# Patient Record
Sex: Male | Born: 1990 | Race: Black or African American | Hispanic: No | Marital: Single | Smoking: Never smoker
Health system: Southern US, Community
[De-identification: ages and names within clinical notes are randomized; demographics above are authoritative.]

## PROBLEM LIST (undated history)

## (undated) ENCOUNTER — Emergency Department (HOSPITAL_COMMUNITY): Admission: EM | Payer: Self-pay | Source: Home / Self Care

## (undated) DIAGNOSIS — E119 Type 2 diabetes mellitus without complications: Secondary | ICD-10-CM

## (undated) HISTORY — PX: KNEE SURGERY: SHX244

## (undated) HISTORY — PX: HAND SURGERY: SHX662

---

## 2015-10-02 ENCOUNTER — Emergency Department (HOSPITAL_COMMUNITY)
Admission: EM | Admit: 2015-10-02 | Discharge: 2015-10-02 | Disposition: A | Discharge status: Home / Self Care | Payer: Self-pay | Attending: Emergency Medicine | Admitting: Emergency Medicine

## 2015-10-02 ENCOUNTER — Encounter (HOSPITAL_COMMUNITY): Payer: Self-pay | Admitting: Emergency Medicine

## 2015-10-02 DIAGNOSIS — R21 Rash and other nonspecific skin eruption: Secondary | ICD-10-CM | POA: Insufficient documentation

## 2015-10-02 DIAGNOSIS — E109 Type 1 diabetes mellitus without complications: Secondary | ICD-10-CM | POA: Insufficient documentation

## 2015-10-02 MED ORDER — CLOTRIMAZOLE 1 % EX CREA: TOPICAL_CREAM | CUTANEOUS | Status: DC

## 2015-10-02 NOTE — ED Notes (Signed)
Pt. reports dry skin and multiple " small pimples " at shoulders , scalp and upper back with mild itching for several weeks .

## 2015-10-02 NOTE — Discharge Instructions (Signed)
Please use your medications as prescribed. Follow-up with your doctor or dermatology for further evaluation management of your symptoms. Return to ED for any new or worsening symptoms.

## 2015-10-02 NOTE — ED Provider Notes (Signed)
CSN: [id number]     Arrival date & time 10/02/15  1951 History  By signing my name below, I, Alan Burke, attest that this documentation has been prepared under the direction and in the presence of Alan Peek, PA-C  Electronically Signed: Jarvis Burke, ED Scribe. 10/02/2015. 8:44 PM.    Chief Complaint  Patient presents with  . Skin Problem   The history is provided by the patient. No language interpreter was used.    HPI Comments: Alan Burke is a 24 y.o. male with a h/o type I DM who presents to the Emergency Department complaining of dry, scabbed, skin to his scalp onset several weeks. He reports associated patches of itchy, dry, red skin to his shoulders and upper back. He denies any h/o similar skin rashes. Pt states he used to regularly dye his hair which he believes could have been the cause of the scabbing to her scalp. He denies any alleviating/aggravating factors. Pt has not taken any medications prior to arrival. He denies any new soaps, detergents, lotions, medications, etc. Pt states he does not regularly wear hats but he used to. Pt takes insulin 70/30 on sliding scale. He denies any mouth lesions, or other associated symptoms at this time.  History reviewed. No pertinent past medical history. History reviewed. No pertinent past surgical history. No family history on file. Social History  Substance Use Topics  . Smoking status: Never Smoker   . Smokeless tobacco: None  . Alcohol Use: No    Review of Systems  All other systems reviewed and are negative.     Allergies  Review of patient's allergies indicates no known allergies.  Home Medications   Prior to Admission medications   Medication Sig Start Date End Date Taking? Authorizing Provider  clotrimazole (LOTRIMIN) 1 % cream Apply to affected area 2 times daily 10/02/15   Alan Peek, PA-C   Triage Vitals: BP 137/73 mmHg  Pulse 69  Temp(Src) 98.6 F (37 C) (Oral)  Resp 16  SpO2  98%  Physical Exam  Constitutional: He is oriented to person, place, and time. He appears well-developed and well-nourished. No distress.  HENT:  Head: Normocephalic and atraumatic.  Area of mild folliculitis to superior occiput area. Oropharynx is clear and moist with no lesions.  Eyes: Conjunctivae and EOM are normal.  Neck: Neck supple. No tracheal deviation present.  Cardiovascular: Normal rate.   Pulmonary/Chest: Effort normal. No respiratory distress.  Musculoskeletal: Normal range of motion.  Neurological: He is alert and oriented to person, place, and time.  Skin: Skin is warm and dry.  Small, approximately 1cm in diameter lesion to right scapular shoulder. Area is dry, scaling and circumferential with no central clearing. No erythema, induration. Nontender. No drainage   Psychiatric: He has a normal mood and affect. His behavior is normal.  Nursing note and vitals reviewed.   ED Course  Procedures (including critical care time)  DIAGNOSTIC STUDIES: Oxygen Saturation is 98% on RA, normal by my interpretation.    COORDINATION OF CARE: 8:37 PM- will d/c pt home with antifungal. Pt advised of plan for treatment and pt agrees.     Labs Review Labs Reviewed - No data to display  Imaging Review No results found.   EKG Interpretation None     Meds given in ED:  Medications - No data to display  New Prescriptions   CLOTRIMAZOLE (LOTRIMIN) 1 % CREAM    Apply to affected area 2 times daily   Filed Vitals:  10/02/15 1959  BP: 137/73  Pulse: 69  Temp: 98.6 F (37 C)  TempSrc: Oral  Resp: 16  SpO2: 98%    MDM   Final diagnoses:  Rash   Patient with non specific rash. Not improved with hydrocortisone cream. Will treat with trial of anti-fungal medication. Pt instructed to keep the area dry. Contact precautions given. No signs of secondary infection. Follow up with Dermatology in 2-3 days. Return precautions discussed. Pt is safe for discharge at this  time. I personally performed the services described in this documentation, which was scribed in my presence. The recorded information has been reviewed and is accurate.    Alan PeekBenjamin Keisean Skowron, PA-C 10/02/15 2045  Alvira MondayErin Schlossman, MD 10/06/15 [phone]

## 2015-10-28 ENCOUNTER — Emergency Department (HOSPITAL_COMMUNITY)
Admission: EM | Admit: 2015-10-28 | Discharge: 2015-10-28 | Disposition: A | Discharge status: Home / Self Care | Payer: Self-pay | Attending: Emergency Medicine | Admitting: Emergency Medicine

## 2015-10-28 ENCOUNTER — Encounter (HOSPITAL_COMMUNITY): Payer: Self-pay | Admitting: Emergency Medicine

## 2015-10-28 DIAGNOSIS — R238 Other skin changes: Secondary | ICD-10-CM | POA: Insufficient documentation

## 2015-10-28 DIAGNOSIS — E119 Type 2 diabetes mellitus without complications: Secondary | ICD-10-CM | POA: Insufficient documentation

## 2015-10-28 DIAGNOSIS — Z79899 Other long term (current) drug therapy: Secondary | ICD-10-CM | POA: Insufficient documentation

## 2015-10-28 DIAGNOSIS — Z113 Encounter for screening for infections with a predominantly sexual mode of transmission: Secondary | ICD-10-CM | POA: Insufficient documentation

## 2015-10-28 DIAGNOSIS — Z711 Person with feared health complaint in whom no diagnosis is made: Secondary | ICD-10-CM

## 2015-10-28 HISTORY — DX: Type 2 diabetes mellitus without complications: E11.9

## 2015-10-28 NOTE — ED Notes (Signed)
C/o "bumps on penis" sts may not be new, no discharge or pain or other complaints, NAD

## 2015-10-28 NOTE — Discharge Instructions (Signed)
If your test results are positive, you will receive a call from the hospital. If they are negative, you will not be called. This takes 2-3 days.  Sexually Transmitted Disease  A sexually transmitted disease (STD) is a disease or infection that may be passed (transmitted) from person to person, usually during sexual activity. This may happen by way of saliva, semen, blood, vaginal mucus, or urine. Common STDs include:  Gonorrhea.  Chlamydia.  Syphilis.  HIV and AIDS.  Genital herpes.  Hepatitis B and C.  Trichomonas.  Human papillomavirus (HPV).  Pubic lice.  Scabies.  Mites.  Bacterial vaginosis. WHAT ARE CAUSES OF STDs?  An STD may be caused by bacteria, a virus, or parasites. STDs are often transmitted during sexual activity if one person is infected. However, they may also be transmitted through nonsexual means. STDs may be transmitted after:  Sexual intercourse with an infected person.  Sharing sex toys with an infected person.  Sharing needles with an infected person or using unclean piercing or tattoo needles.  Having intimate contact with the genitals, mouth, or rectal areas of an infected person.  Exposure to infected fluids during birth. WHAT ARE THE SIGNS AND SYMPTOMS OF STDs?  Different STDs have different symptoms. Some people may not have any symptoms. If symptoms are present, they may include:  Painful or bloody urination.  Pain in the pelvis, abdomen, vagina, anus, throat, or eyes.  A skin rash, itching, or irritation.  Growths, ulcerations, blisters, or sores in the genital and anal areas.  Abnormal vaginal discharge with or without bad odor.  Penile discharge in men.  Fever.  Pain or bleeding during sexual intercourse.  Swollen glands in the groin area.  Yellow skin and eyes (jaundice). This is seen with hepatitis.  Swollen testicles.  Infertility.  Sores and blisters in the mouth. HOW ARE STDs DIAGNOSED?  To make a diagnosis, your health care provider may:    Take a medical history.  Perform a physical exam.  Take a sample of any discharge to examine.  Swab the throat, cervix, opening to the penis, rectum, or vagina for testing.  Test a sample of your first morning urine.  Perform blood tests.  Perform a Pap test, if this applies.  Perform a colposcopy.  Perform a laparoscopy. HOW ARE STDs TREATED?  Treatment depends on the STD. Some STDs may be treated but not cured.  Chlamydia, gonorrhea, trichomonas, and syphilis can be cured with antibiotic medicine.  Genital herpes, hepatitis, and HIV can be treated, but not cured, with prescribed medicines. The medicines lessen symptoms.  Genital warts from HPV can be treated with medicine or by freezing, burning (electrocautery), or surgery. Warts may come back.  HPV cannot be cured with medicine or surgery. However, abnormal areas may be removed from the cervix, vagina, or vulva.  If your diagnosis is confirmed, your recent sexual partners need treatment. This is true even if they are symptom-free or have a negative culture or evaluation. They should not have sex until their health care providers say it is okay.  Your health care provider may test you for infection again 3 months after treatment. HOW CAN I REDUCE MY RISK OF GETTING AN STD?  Take these steps to reduce your risk of getting an STD:  Use latex condoms, dental dams, and water-soluble lubricants during sexual activity. Do not use petroleum jelly or oils.  Avoid having multiple sex partners.  Do not have sex with someone who has other sex partners  Do not have sex with anyone you do not know or who is at high risk for an STD.  Avoid risky sex practices that can break your skin.  Do not have sex if you have open sores on your mouth or skin.  Avoid drinking too much alcohol or taking illegal drugs. Alcohol and drugs can affect your judgment and put you in a vulnerable position.  Avoid engaging in oral and anal sex acts.  Get vaccinated for HPV  and hepatitis. If you have not received these vaccines in the past, talk to your health care provider about whether one or both might be right for you.  If you are at risk of being infected with HIV, it is recommended that you take a prescription medicine daily to prevent HIV infection. This is called pre-exposure prophylaxis (PrEP). You are considered at risk if:  You are a man who has sex with other men (MSM).  You are a heterosexual man or woman and are sexually active with more than one partner.  You take drugs by injection.  You are sexually active with a partner who has HIV. Talk with your health care provider about whether you are at high risk of being infected with HIV. If you choose to begin PrEP, you should first be tested for HIV. You should then be tested every 3 months for as long as you are taking PrEP. WHAT SHOULD I DO IF I THINK I HAVE AN STD?  See your health care provider.  Tell your sexual partner(s). They should be tested and treated for any STDs.  Do not have sex until your health care provider says it is okay. WHEN SHOULD I GET IMMEDIATE MEDICAL CARE?  Contact your health care provider right away if:  You have severe abdominal pain.  You are a man and notice swelling or pain in your testicles.  You are a woman and notice swelling or pain in your vagina. This information is not intended to replace advice given to you by your health care provider. Make sure you discuss any questions you have with your health care provider.  Document Released: 12/22/2002 Document Revised: 10/22/2014 Document Reviewed: 04/21/2013  Elsevier Interactive Patient Education [employer]

## 2015-10-28 NOTE — ED Provider Notes (Signed)
CSN: [id number]     Arrival date & time 10/28/15  1259 History  By signing my name below, I, Soijett Blue, attest that this documentation has been prepared under the direction and in the presence of Alveta Heimlich, PA-C Electronically Signed: Soijett Blue, ED Scribe. 10/28/2015. 1:59 PM.   Chief Complaint  Patient presents with  . Rash   The history is provided by the patient. No language interpreter was used.   Alan Burke is a 25 y.o. male with a medical hx of DM who presents to the Emergency Department requesting STD testing. He notes that his girlfriend has PID and that his girlfriend thinks that he gave it to her. He reports that he has bumps to his penis and that they have been there forever. Pt states that he scratched one yesterday due to it itching and that it popped. He reports that he would like a full STD testing to appease his girlfriend. He states that he has not tried any medications for the relief for his symptoms. He denies abdominal pain, dysuria, penile pain/swelling/discharge, testicular pain/swelling, painful BMs and any other symptoms.   Past Medical History  Diagnosis Date  . Diabetes mellitus without complication (HCC)    History reviewed. No pertinent past surgical history. No family history on file. Social History  Substance Use Topics  . Smoking status: Never Smoker   . Smokeless tobacco: None  . Alcohol Use: No    Review of Systems  Genitourinary: Negative for dysuria, discharge, penile swelling, scrotal swelling, genital sores, penile pain and testicular pain.  All other systems reviewed and are negative.     Allergies  Review of patient's allergies indicates no known allergies.  Home Medications   Prior to Admission medications   Medication Sig Start Date End Date Taking? Authorizing Provider  clotrimazole (LOTRIMIN) 1 % cream Apply to affected area 2 times daily 10/02/15   Joycie Peek, PA-C   BP 119/68 mmHg  Pulse 91  Temp(Src) 98.3 F  (36.8 C) (Oral)  Resp 18  Ht 5\' 8"  (1.727 m)  Wt 79.561 kg  BMI 26.68 kg/m2  SpO2 97% Physical Exam  Constitutional: He appears well-developed and well-nourished. No distress.  HENT:  Head: Normocephalic and atraumatic.  Right Ear: External ear normal.  Left Ear: External ear normal.  Eyes: Conjunctivae are normal. Right eye exhibits no discharge. Left eye exhibits no discharge. No scleral icterus.  Neck: Normal range of motion.  Cardiovascular: Normal rate and regular rhythm.   Pulmonary/Chest: Effort normal. No respiratory distress.  Abdominal: Soft. There is no tenderness.  Genitourinary: Testes normal and penis normal. Right testis shows no swelling and no tenderness. Left testis shows no swelling and no tenderness. Circumcised. No penile erythema or penile tenderness. No discharge found.  Small, flesh colored papules at base of penis. No vesicles, pustules, erythema, open sore, or rash noted. No penile discharge. Penis is non-tender. Testicles are non-tender without swelling or induration. No inguinal adenopathy.  Musculoskeletal: Normal range of motion.  Moves all extremities spontaneously  Lymphadenopathy:       Right: No inguinal adenopathy present.       Left: No inguinal adenopathy present.  Neurological: He is alert. Coordination normal.  Skin: Skin is warm and dry.  Psychiatric: He has a normal mood and affect. His behavior is normal.  Nursing note and vitals reviewed.   ED Course  Procedures (including critical care time) DIAGNOSTIC STUDIES: Oxygen Saturation is 99% on RA, nl by my interpretation.  COORDINATION OF CARE: 1:57 PM Discussed treatment plan with pt at bedside which includes HIV antibody, RPR, UA and pt agreed to plan.  Labs Review Labs Reviewed  RPR  HIV ANTIBODY (ROUTINE TESTING)  GC/CHLAMYDIA PROBE AMP ([place]) NOT AT [facility]    Imaging Review No results found. I have personally reviewed and evaluated these lab results as part of my  medical decision-making.   EKG Interpretation None      MDM   Final diagnoses:  Concern about STD in male without diagnosis   25 year old male presenting for STD testing. Girlfriend recently treated for PID and wants him to be tested. He notes small "bumps" to the penis that he believes has been there his whole life. VSS. Few small, flesh colored papules at base of penis. No concerning rash. No pain or swelling of penis and testicles. Abdomen is soft, non-tender. Pt declines urethral swab. Will send GC urine and RPR/HIV. Discussed importance of using protection when sexually active. Pt understands that they have GC/Chlamydia cultures pending and that they will need to inform all sexual partners if results return positive. Return precautions given in discharge paperwork and discussed with pt at bedside. Pt stable for discharge  I personally performed the services described in this documentation, which was scribed in my presence. The recorded information has been reviewed and is accurate.    Alveta HeimlichStevi Nikeshia Keetch, PA-C 10/28/15 1450  Bethann BerkshireJoseph Zammit, MD 10/29/15 [phone]

## 2015-10-29 LAB — HIV ANTIBODY (ROUTINE TESTING W REFLEX): HIV Screen 4th Generation wRfx: NONREACTIVE

## 2015-10-29 LAB — RPR: RPR: NONREACTIVE

## 2015-10-31 LAB — GC/CHLAMYDIA PROBE AMP (~~LOC~~) NOT AT ARMC
CHLAMYDIA, DNA PROBE: NEGATIVE
Neisseria Gonorrhea: NEGATIVE

## 2015-11-01 ENCOUNTER — Encounter (HOSPITAL_COMMUNITY): Payer: Self-pay | Admitting: Emergency Medicine

## 2015-11-01 ENCOUNTER — Emergency Department (HOSPITAL_COMMUNITY)
Admission: EM | Admit: 2015-11-01 | Discharge: 2015-11-01 | Disposition: A | Discharge status: Home / Self Care | Payer: Self-pay | Attending: Emergency Medicine | Admitting: Emergency Medicine

## 2015-11-01 DIAGNOSIS — R590 Localized enlarged lymph nodes: Secondary | ICD-10-CM | POA: Insufficient documentation

## 2015-11-01 DIAGNOSIS — Z79899 Other long term (current) drug therapy: Secondary | ICD-10-CM | POA: Insufficient documentation

## 2015-11-01 DIAGNOSIS — Z202 Contact with and (suspected) exposure to infections with a predominantly sexual mode of transmission: Secondary | ICD-10-CM | POA: Insufficient documentation

## 2015-11-01 DIAGNOSIS — E119 Type 2 diabetes mellitus without complications: Secondary | ICD-10-CM | POA: Insufficient documentation

## 2015-11-01 NOTE — ED Provider Notes (Signed)
CSN: [id number]     Arrival date & time 11/01/15  1247 History  By signing my name below, I, [name], attest that this documentation has been prepared under the direction and in the presence of Kerrie Buffalo, NP  Electronically Signed: [name], ED Scribe. 11/02/2015. 12:46 AM.     Chief Complaint  Patient presents with  . Exposure to STD    Patient is a 25 y.o. male presenting with STD exposure. The history is provided by the patient. No language interpreter was used.  Exposure to STD This is a new problem. The current episode started more than 1 week ago. The problem occurs rarely. The problem has not changed since onset.Pertinent negatives include no abdominal pain. Nothing aggravates the symptoms. Nothing relieves the symptoms. He has tried nothing for the symptoms.    HPI Comments: Alan Burke is a 25 y.o. male who presents to the Emergency Department requesting STD testing. Pt reports associated red and painful bumps to his genital area for at least 1 week. He notes that his girlfriend has recently been diagnosed with a UTI and BV and claims she likely has herpes. Pt was here 3 days ago for STD testing and endorses he had a blood test and urine test done but that they did not test him for herpes. Pt is requesting a herpes swab at this time. He denies any h/o STIs. Pt denies any abdominal pain, dysuria, penile pain/swelling, testicular pain or swelling or other associated symptoms at this time.   Past Medical History  Diagnosis Date  . Diabetes mellitus without complication (HCC)    History reviewed. No pertinent past surgical history. History reviewed. No pertinent family history. Social History  Substance Use Topics  . Smoking status: Never Smoker   . Smokeless tobacco: None  . Alcohol Use: No    Review of Systems  Gastrointestinal: Negative for abdominal pain.  Genitourinary: Positive for genital sores. Negative for dysuria, discharge, penile swelling, scrotal  swelling, difficulty urinating, penile pain and testicular pain.      Allergies  Review of patient's allergies indicates no known allergies.  Home Medications   Prior to Admission medications   Medication Sig Start Date End Date Taking? Authorizing Provider  clotrimazole (LOTRIMIN) 1 % cream Apply to affected area 2 times daily 10/02/15   Joycie Peek, PA-C   BP 120/78 mmHg  Pulse 80  Temp(Src) 97.7 F (36.5 C) (Oral)  Resp 16  Ht  (1.727 m)  Wt 79.379 kg  BMI 26.61 kg/m2  SpO2 100% Physical Exam  Constitutional: He is oriented to person, place, and time. He appears well-developed and well-nourished. No distress.  HENT:  Head: Normocephalic.  Eyes: EOM are normal.  Neck: Neck supple.  Cardiovascular: Normal rate.   Pulmonary/Chest: Effort normal.  Abdominal: Soft. There is no tenderness.  Genitourinary: Right testis shows no swelling and no tenderness. Left testis shows no swelling and no tenderness. Circumcised. No discharge found.  No urethral discharge noted. There are 3 tiny dry areas on the shaft of the penis, non tender.   Musculoskeletal: Normal range of motion.  Lymphadenopathy:       Right: Inguinal (pea sized) adenopathy present.  Neurological: He is alert and oriented to person, place, and time. No cranial nerve deficit.  Skin: Skin is warm and dry.  Psychiatric: He has a normal mood and affect. His behavior is normal.  Nursing note and vitals reviewed.   ED Course  Procedures (including critical care time)  DIAGNOSTIC STUDIES: Oxygen Saturation is 98% on RA, normal by my interpretation.    COORDINATION OF CARE: 1:47 PM-Will order herpes swab and GC/Chlamydia testing. Pt advised of plan for treatment and pt agrees.  Labs Review Labs Reviewed  HERPES SIMPLEX VIRUS CULTURE  GC/CHLAMYDIA PROBE AMP ([place]) NOT AT [facility]      MDM  25 y.o. male here for STD check and concern for possible HSV although lesions are a week old and dry.  Discussed with the patient signs and symptoms of HSV and possible recurrence if that was what the lesions were. STD testing done including HIV and RPR, GC, Chlamydia.   Final diagnoses:  STD exposure   I personally performed the services described in this documentation, which was scribed in my presence. The recorded information has been reviewed and is accurate.     [address] [place], [place] 11/02/15 1610  Lyndal Pulley, MD 11/02/15 [phone]

## 2015-11-01 NOTE — ED Notes (Signed)
Urine at bedside. 

## 2015-11-01 NOTE — ED Notes (Signed)
Pt requesting STD check; pt sts has some bumps in genital area

## 2015-11-02 LAB — GC/CHLAMYDIA PROBE AMP (~~LOC~~) NOT AT ARMC
Chlamydia: NEGATIVE
Neisseria Gonorrhea: NEGATIVE

## 2015-11-03 LAB — HERPES SIMPLEX VIRUS CULTURE: Culture: NOT DETECTED

## 2015-11-05 ENCOUNTER — Emergency Department (HOSPITAL_COMMUNITY): Payer: Self-pay

## 2015-11-05 ENCOUNTER — Encounter (HOSPITAL_COMMUNITY): Payer: Self-pay | Admitting: Emergency Medicine

## 2015-11-05 ENCOUNTER — Emergency Department (HOSPITAL_COMMUNITY)
Admission: EM | Admit: 2015-11-05 | Discharge: 2015-11-05 | Disposition: A | Discharge status: Home / Self Care | Payer: Self-pay | Attending: Emergency Medicine | Admitting: Emergency Medicine

## 2015-11-05 DIAGNOSIS — M791 Myalgia, unspecified site: Secondary | ICD-10-CM

## 2015-11-05 DIAGNOSIS — R059 Cough, unspecified: Secondary | ICD-10-CM

## 2015-11-05 DIAGNOSIS — E1165 Type 2 diabetes mellitus with hyperglycemia: Secondary | ICD-10-CM | POA: Insufficient documentation

## 2015-11-05 DIAGNOSIS — Z79899 Other long term (current) drug therapy: Secondary | ICD-10-CM | POA: Insufficient documentation

## 2015-11-05 DIAGNOSIS — R05 Cough: Secondary | ICD-10-CM | POA: Insufficient documentation

## 2015-11-05 DIAGNOSIS — R21 Rash and other nonspecific skin eruption: Secondary | ICD-10-CM

## 2015-11-05 DIAGNOSIS — R739 Hyperglycemia, unspecified: Secondary | ICD-10-CM

## 2015-11-05 LAB — URINE MICROSCOPIC-ADD ON: WBC UA: NONE SEEN WBC/hpf (ref 0–5)

## 2015-11-05 LAB — RPR: RPR Ser Ql: NONREACTIVE

## 2015-11-05 LAB — BASIC METABOLIC PANEL
Anion gap: 8 (ref 5–15)
BUN: 6 mg/dL (ref 6–20)
CALCIUM: 8.9 mg/dL (ref 8.9–10.3)
CO2: 25 mmol/L (ref 22–32)
CREATININE: 1.01 mg/dL (ref 0.61–1.24)
Chloride: 104 mmol/L (ref 101–111)
GFR calc Af Amer: >60 mL/min (ref >60)
GLUCOSE: 385 mg/dL — AB (ref 65–99)
POTASSIUM: 4.3 mmol/L (ref 3.5–5.1)
SODIUM: 137 mmol/L (ref 135–145)

## 2015-11-05 LAB — CBC
HCT: 40.4 % (ref 39.0–52.0)
Hemoglobin: 13.3 g/dL (ref 13.0–17.0)
MCH: 27.4 pg (ref 26.0–34.0)
MCHC: 32.9 g/dL (ref 30.0–36.0)
MCV: 83.3 fL (ref 78.0–100.0)
PLATELETS: 182 10*3/uL (ref 150–400)
RBC: 4.85 MIL/uL (ref 4.22–5.81)
RDW: 13.3 % (ref 11.5–15.5)
WBC: 9 10*3/uL (ref 4.0–10.5)

## 2015-11-05 LAB — URINALYSIS, ROUTINE W REFLEX MICROSCOPIC
BILIRUBIN URINE: NEGATIVE
HGB URINE DIPSTICK: NEGATIVE
Ketones, ur: NEGATIVE mg/dL
Leukocytes, UA: NEGATIVE
Nitrite: NEGATIVE
PROTEIN: NEGATIVE mg/dL
Specific Gravity, Urine: 1.018 (ref 1.005–1.030)
pH: 6 (ref 5.0–8.0)

## 2015-11-05 LAB — CK: Total CK: 304 U/L (ref 49–397)

## 2015-11-05 LAB — HIV ANTIBODY (ROUTINE TESTING W REFLEX): HIV Screen 4th Generation wRfx: NONREACTIVE

## 2015-11-05 NOTE — Discharge Instructions (Signed)
°Emergency Department Resource Guide °1) Find a Doctor and Pay Out of Pocket °Although you won't have to find out who is covered by your insurance plan, it is a good idea to ask around and get recommendations. You will then need to call the office and see if the doctor you have chosen will accept you as a new patient and what types of options they offer for patients who are self-pay. Some doctors offer discounts or will set up payment plans for their patients who do not have insurance, but you will need to ask so you aren't surprised when you get to your appointment. ° °2) Contact Your Local Health Department °Not all health departments have doctors that can see patients for sick visits, but many do, so it is worth a call to see if yours does. If you don't know where your local health department is, you can check in your phone book. The CDC also has a tool to help you locate your state's health department, and many state websites also have listings of all of their local health departments. ° °3) Find a Walk-in Clinic °If your illness is not likely to be very severe or complicated, you may want to try a walk in clinic. These are popping up all over the country in pharmacies, drugstores, and shopping centers. They're usually staffed by nurse practitioners or physician assistants that have been trained to treat common illnesses and complaints. They're usually fairly quick and inexpensive. However, if you have serious medical issues or chronic medical problems, these are probably not your best option. ° °No Primary Care Doctor: °- Call [facility] at  832-8000 - they can help you locate a primary care doctor that  accepts your insurance, provides certain services, etc. °- Physician Referral Service- 1-800-533-3463 ° °Chronic Pain Problems: °Organization         Address  Phone   Notes  °[facility]  (336) 297-2271 Patients need to be referred by their primary care doctor.  ° °Medication  Assistance: °Organization         Address  Phone   Notes  °[place] [facility] [address] °[place], [place] [address] (336) 641-8030 --Must be a resident of [place] °-- Must have NO insurance coverage whatsoever (no Medicaid/ Medicare, etc.) °-- The pt. MUST have a primary care doctor that directs their care regularly and follows them in the community °  °[facility]  (866) 331-1348   °[employer]  (888) 892-1162   ° °Agencies that provide inexpensive medical care: °Organization         Address  Phone   Notes  °[facility]  (336) 832-8035   °[facility]    (336) 832-7272   °[facility] [address] °[place], [place] [address] (336) 832-4777   °[facility] [address], °[place] (336) 271-4999   °[facility]    (336) 373-0678   °[facility]    (336) 272-1050   °[facility] ° [address], [place] Phone:  (336) 832-4444, Fax:  (336) 832-4440 Hours of Operation:  9 am - 6 pm, M-F.  Also accepts Medicaid/Medicare and self-pay.  °[facility] ° [address], [place] Phone: (336) 832-3150, Fax: (336) 832-3151. Hours of Operation:  8:30 am - 5:30 pm, M-F.  Also accepts Medicaid and self-pay.  °[facility] [place] [address], [place] Phone: (336) 878-6027   °[facility] [address], [place], [place] (336)723-1848, Ext. 123 Mondays & Thursdays: 7-9 AM.  First 15 patients are seen on a first come, first serve basis. °  ° °Medicaid-accepting [place] Providers: ° °Organization         Address  Phone   Notes  °[facility] [address], [place] (336) 641-2100 Also accepts self-pay patients.  °[facility] [address], [place] ° (336) 856-9996   °[facility] [address], [place]  (336) 288-8857   °[facility] [address] [place] [address], [place] (336) 299-7000   °Veita Bland [address], [place]  ° (336) 373-1557 Only accepts [place] Access Medicaid patients after they have their name applied to their card.  ° °Self-Pay (no insurance) in [place]: ° °Organization         Address  Phone   Notes  °Sickle Cell Patients, [facility] [address], [place] (336) 832-1970   °[facility] [address], [place] (336) 832-4400   °[facility] ° [address], [place] (336) 992-4800   °[facility]/Dr. Osei-Bonsu ° [address] [place] [address], [place] or [address], [place] (336) 841-8500 Phone number for both [place] and [place] locations is the same.  °[facility] [address], [place] (336) 299-0000   °[facility] [address] [place] [address], [place] or [address] (336) 852-7530 °(336) 878-2260   °[facility] [address], [place] (336) 350-1642, phone; (336) 294-5005, fax Sees patients 1st and 3rd Saturday of every month.  Must not qualify for public or private insurance (i.e. Medicaid, Medicare, [place] Health Choice, Veterans' Benefits) • Household income should be no more than 200% of the poverty level •The clinic cannot treat you if you are pregnant or think you are pregnant • Sexually transmitted diseases are not treated at the clinic.  ° ° °Dental Care: °Organization         Address  Phone  Notes  °[place] [facility] [address], [place] (336) 641-6152 Accepts children up to age 21 who are enrolled in Medicaid or [place] Health Choice; pregnant women with a Medicaid card; and children who have applied for Medicaid or [place] Health Choice, but were declined, whose parents can pay a reduced fee at time of service.  °[place]  [facility] [place]  [address], [place] (336) 641-7733 Accepts children up to age 21 who are enrolled in Medicaid or [place] Health Choice; pregnant women with a Medicaid card; and children who have applied for Medicaid or [place] Health Choice, but were declined, whose parents can pay a reduced fee at time of service.  °[facility] ° [address],  (336) 641-4533 Patients are seen by appointment only. Walk-ins are not accepted. [facility] will see patients 18 years of age and older. °Monday - Tuesday (8am-5pm) °Most Wednesdays (8:30-5pm) °$30 per visit, cash only  °[facility] ° [address], [place] (336) 641-4533 Patients are seen by appointment only. Walk-ins are not accepted. [facility] will see patients 18 years of age and older. °One   Wednesday Evening (Monthly: Volunteer Based).  $30 per visit, cash only  °[facility]  (919) 537-3737 for adults; Children under age 4, call Graduate Pediatric Dentistry at (919) [phone]. Children aged 4-14, please call (919) 537-3737 to request a pediatric application. ° Dental services are provided in all areas of dental care including fillings, crowns and bridges, complete and partial dentures, implants, gum treatment, root canals, and extractions. Preventive care is also provided. Treatment is provided to both adults and children. °Patients are selected via a lottery and there is often a waiting list. °  °[facility] [address], °[place] ° (336) 763-8833 [email or web address] °  °[facility] [address], [place], [place] (336)723-1848, Ext. 123 Second and Fourth Thursday of each month, opens at 6:30 AM; Clinic ends at 9 AM.  Patients are seen on a first-come first-served basis, and a limited number are seen during each clinic.  ° °[facility] ° [address], [place], [place] (336) 723-7904    Eligibility Requirements °You must have lived in [place], [place], or [place] counties for at least the last three months. °  You cannot be eligible for state or federal sponsored healthcare insurance, including Veterans Administration, Medicaid, or Medicare. °  You generally cannot be eligible for healthcare insurance through your employer.  °  How to apply: °Eligibility screenings are held every Tuesday and Wednesday afternoon from 1:00 pm until 4:00 pm. You do not need an appointment for the interview!  °[facility] [address], [place], [place] 336-631-2330   °[facility] [facility]  336-342-8273   °[facility]  336-703-3100   °[facility]  336-570-6415   ° °Behavioral Health Resources in the Community: °Intensive Outpatient Programs °Organization         Address  Phone  Notes  °[facility] [address], [place], [place] 336-878-6098   °[facility] [address], [place], [place] 336-832-9800   °[facility] [address], [place], [place] ° 336-882-2125   °[facility] [address],  °[place], [place] 1-800-853-5163 or 336-641-4981   °Substance Abuse Resources °Organization         Address  Phone  Notes  °[facility]  336-882-2125   °[facility]  336-784-9470   °[facility]  336-285-9073   °[facility]  336-845-3988   °Residential & Outpatient Substance Abuse Program  1-800-659-3381   °Psychological Services °Organization         Address  Phone  Notes  °[facility]  336- 832-9600   °[facility]  336- 378-7881   °[facility] [address], [place] 1-800-853-5163 or 336-641-4981   ° °Mobile Crisis Teams °Organization         Address  Phone  Notes  °[facility], Mobile Crisis Care Unit  1-877-626-1772   °[facility]ive °[facility] ° [address]  [place], [place] 336-834-9664   °Sharon DeEsch [address] °[place] 336-554-5454   ° °Self-Help/Support Groups °Organization         Address  Phone             Notes  °[facility]  - variety of support groups  336- 373-1402 Call for more information  °Narcotics Anonymous (NA), [facility] [address], °[place]  2 meetings at this location  ° °  Residential Treatment Programs °Organization         Address  Phone  Notes  °[facility] [address],    °[place]  1-866-801-8205   °[facility] ° [address], [place], [place] 704-293-8524   °[facility] [address], [place] 336-845-3988 Admissions: 8am-3pm M-F  °[facility] [address]    °[place], [place] 336-841-1104   °[facility] [address], [place], [place] 336-379-7146   °[facility] [address]  °[place], [place] 336-285-9073   °[facility] - Intensive Outpatient [address], [place], [place] 336-852-3033   °[facility] [address]  °[place], [place] 1-877-615-2722 or 336-784-9470   °[facility] [address] [place], [place] 336-227-7417 Accepts Medicaid  °[facility] [address]  °[place] 1-800-659-3381 Substance Abuse/Addiction Treatment  ° °[place] Behavioral Health Resources °Organization         Address  Phone  Notes  °[facility]  (888) 581-9988   °Julie Brannon, PhD [address] [place], [place]   (336) 349-5553 or (336) 951-0000   °[facility]   [address] °[place], [place] (336) 349-4454   °[facility] [address], [place], [place] (336) 342-8316 Insurance/Medicaid/sponsorship through [facility]  °[facility] [address]                                    [place], [place] (336) 342-8316 Therapy/tele-psych/case    °[facility] [address]  ° [place], [place] (336) 349-2233    °Dr. Arfeen  (336) 349-4544   °[facility] [place]  [employer] [place] [facility] 1) [address], [place] °2) [address], [place] °3)  [address], [place] (336) 349-3220 °(336) 342-7768 ° °(336) 342-8140   °[place] [facility] (336) 342-1394 or (336) 342-3537 (After Hours)    ° ° °

## 2015-11-05 NOTE — ED Notes (Signed)
Pt requesting herpes test.  States he was seen here for same already and when he looked at his My Chart account it only had HIV results.  Also c/o bumps on head x 1 year.

## 2015-11-05 NOTE — ED Provider Notes (Signed)
CSN: [id number]   Arrival date & time 11/05/15 0208  History  By signing my name below, I, Bethel Born, attest that this documentation has been prepared under the direction and in the presence of Azalia Bilis, MD. Electronically Signed: Bethel Born, ED Scribe. 11/05/2015. 3:49 AM.  Chief Complaint  Patient presents with  . Rash    HPI The history is provided by the patient. No language interpreter was used.   Alan Burke is a 25 y.o. male with PMHx of Type I DM who presents to the Emergency Department complaining of a rash at the penis with onset 2 weeks ago. Pt is concerned for herpes as his girlfriend was recently diagnosed. Associated symptoms include increased urinary frequency. Pt denies fever, penile discharge, and dysuria. He also complains of a pruritic rash at the neck and back, myalgias that are worse at the left chest and back, cough for 2 weeks, SOB with exertion, and headache. Pt states that he does not measure his blood sugar but administers insulin as he feels that it is necessary.   Past Medical History  Diagnosis Date  . Diabetes mellitus without complication (HCC)     History reviewed. No pertinent past surgical history.  No family history on file.  Social History  Substance Use Topics  . Smoking status: Never Smoker   . Smokeless tobacco: None  . Alcohol Use: No     Review of Systems 10 Systems reviewed and all are negative for acute change except as noted in the HPI. Home Medications   Prior to Admission medications   Medication Sig Start Date End Date Taking? Authorizing Provider  clotrimazole (LOTRIMIN) 1 % cream Apply to affected area 2 times daily 10/02/15   Joycie Peek, PA-C    Allergies  Review of patient's allergies indicates no known allergies.  Triage Vitals: BP 120/98 mmHg  Pulse 71  Temp(Src) 97.5 F (36.4 C) (Oral)  Resp 16  SpO2 99%  Physical Exam  Constitutional: He is oriented to person, place, and time. He appears  well-developed and well-nourished.  HENT:  Head: Normocephalic and atraumatic.  Eyes: EOM are normal.  Neck: Normal range of motion.  Cardiovascular: Normal rate, regular rhythm, normal heart sounds and intact distal pulses.   Pulmonary/Chest: Effort normal and breath sounds normal. No respiratory distress.  Abdominal: Soft. He exhibits no distension. There is no tenderness.  Genitourinary:  Normal circumcised penis. No vesicular lesions.   Musculoskeletal: Normal range of motion.  Neurological: He is alert and oriented to person, place, and time.  Skin: Skin is warm and dry.  Punctate papular rash of the back and shoulders  Psychiatric: He has a normal mood and affect. Judgment normal.  Nursing note and vitals reviewed.   ED Course  Procedures   DIAGNOSTIC STUDIES: Oxygen Saturation is 99% on RA, normal by my interpretation.    COORDINATION OF CARE: 3:37 AM Discussed treatment plan which includes lab work and CXR with pt at bedside and pt agreed to plan.  Labs Reviewed  BASIC METABOLIC PANEL - Abnormal; Notable for the following:    Glucose, Bld 385 (*)    All other components within normal limits  URINALYSIS, ROUTINE W REFLEX MICROSCOPIC (NOT AT [facility]) - Abnormal; Notable for the following:    Color, Urine COLORLESS (*)    Glucose, UA >1000 (*)    All other components within normal limits  URINE MICROSCOPIC-ADD ON - Abnormal; Notable for the following:    Squamous Epithelial / LPF 0-5 (*)  Bacteria, UA RARE (*)    All other components within normal limits  CBC  CK  RPR  HIV ANTIBODY (ROUTINE TESTING)    Imaging Review Dg Chest 2 View  11/05/2015  CLINICAL DATA:  25 year old male with cough and chest pain EXAM: CHEST  2 VIEW COMPARISON:  None. FINDINGS: The heart size and mediastinal contours are within normal limits. Both lungs are clear. The visualized skeletal structures are unremarkable. IMPRESSION: No active cardiopulmonary disease. Electronically Signed   By:  Elgie Collard M.D.   On: 11/05/2015 04:31    I personally reviewed and evaluated these images and lab results as a part of my medical decision-making.   MDM   Final diagnoses:  None    Patient is overall well-appearing.  He presents with a multitude of complaints.  His vital signs are normal.  The area on his penis does not seem consistent with herpes.  The rash is nonspecific.  HIV and RPR been sent.  He'll be contacted if these are positive.  In regards to his myalgias as opposed suspect he's had poor blood glucose control over the past week or so and is likely mildly volume depleted.  He will continue to orally hydrate himself with water at home.  Anion gap is normal.  Discharge good condition.  Resources given for primary care physicians in the community.  I personally performed the services described in this documentation, which was scribed in my presence. The recorded information has been reviewed and is accurate.       Azalia Bilis, MD 11/05/15 [id number]

## 2015-11-05 NOTE — ED Notes (Signed)
See EDPs notes for secondary assessment.

## 2016-08-08 ENCOUNTER — Emergency Department (HOSPITAL_COMMUNITY)
Admission: EM | Admit: 2016-08-08 | Discharge: 2016-08-08 | Disposition: A | Discharge status: Home / Self Care | Payer: Self-pay | Attending: Emergency Medicine | Admitting: Emergency Medicine

## 2016-08-08 ENCOUNTER — Encounter (HOSPITAL_COMMUNITY): Payer: Self-pay | Admitting: *Deleted

## 2016-08-08 DIAGNOSIS — K0889 Other specified disorders of teeth and supporting structures: Secondary | ICD-10-CM

## 2016-08-08 DIAGNOSIS — K029 Dental caries, unspecified: Secondary | ICD-10-CM | POA: Insufficient documentation

## 2016-08-08 DIAGNOSIS — E119 Type 2 diabetes mellitus without complications: Secondary | ICD-10-CM | POA: Insufficient documentation

## 2016-08-08 MED ORDER — PENICILLIN V POTASSIUM 500 MG PO TABS: 500.0000 mg | ORAL_TABLET | Freq: Four times a day (QID) | ORAL | 0 refills | Status: AC

## 2016-08-08 NOTE — ED Notes (Signed)
See provider notes for assessment 

## 2016-08-08 NOTE — Discharge Instructions (Signed)
Take the penicillin as prescribed and be sure to complete the entire course. If you experience rash or diarrhea stopped this antibiotic and return to the emergency department. Follow-up with a dentist as soon as possible, this week, to have your teeth reevaluated. Return immediately to the emergency department if you experience swelling of your face, jaw, throat, neck or you experience fever, difficulty swallowing, or any other concerning symptoms.

## 2016-08-08 NOTE — ED Provider Notes (Signed)
[facility] DEPT Provider Note   CSN: [id number] Arrival date & time: 08/08/16  1801  By signing my name below, I, Linna DarnerRussell Turner, attest that this documentation has been prepared under the direction and in the presence of Mattie MarlinJessica Blenda Wisecup, PA-C. Electronically Signed: Linna Darnerussell [place], Scribe. 08/08/2016. 6:31 PM.  History   Chief Complaint Chief Complaint  Patient presents with  . Dental Pain    The history is provided by the patient. No language interpreter was used.     HPI Comments: Alan Burke is a 25 y.o. male who presents to the Emergency Department complaining of gradual onset, intermittent, worsening, throbbing, shooting, dental pain for the last 7-8 months. Pt reports pain "throughout his whole mouth." He states the pain began with a cavity in his right lower teeth and has spread throughout his mouth. Pt notes he broke off  Piece of his right lower teeth last year while eating. He states some of his wisdom teeth (right upper/lower and left lower) have grown in over the last 7-8 months and believes this has caused some pain. He reports his dental pain has been constant and severe for the last 4 days. He endorses pain exacerbation with chewing and with exposure to cold air. He states his gums bleed every time he brushes his teeth. No known allergies to medications. Pt notes a h/o DM for which he uses insulin shots. He denies fever, chills, trouble swallowing, SOB, numbness, weakness, rash, or any other associated symptoms. No regular dentist or dental insurance.  Past Medical History:  Diagnosis Date  . Diabetes mellitus without complication (HCC)     There are no active problems to display for this patient.   History reviewed. No pertinent surgical history.     Home Medications    Prior to Admission medications   Medication Sig Start Date End Date Taking? Authorizing Provider  clotrimazole (LOTRIMIN) 1 % cream Apply to affected area 2 times daily 10/02/15   Joycie PeekBenjamin  Cartner, PA-C  penicillin v potassium (VEETID) 500 MG tablet Take 1 tablet (500 mg total) by mouth 4 (four) times daily. 08/08/16 08/15/16  Jerre SimonJessica L Sommer Spickard, PA    Family History No family history on file.  Social History Social History  Substance Use Topics  . Smoking status: Never Smoker  . Smokeless tobacco: Never Used  . Alcohol use No     Allergies   Review of patient's allergies indicates no known allergies.   Review of Systems Review of Systems  Constitutional: Negative for chills and fever.  HENT: Positive for dental problem. Negative for trouble swallowing.   Respiratory: Negative for shortness of breath.   Skin: Negative for rash.  Neurological: Negative for weakness and numbness.    Physical Exam Updated Vital Signs BP 130/85 (BP Location: Left Arm)   Pulse 87   Temp 97.7 F (36.5 C) (Oral)   Resp 17   SpO2 98%   Physical Exam  Constitutional: He appears well-developed and well-nourished. No distress.  HENT:  Head: Normocephalic and atraumatic.  Mouth/Throat: Uvula is midline, oropharynx is clear and moist and mucous membranes are normal. No trismus in the jaw. Abnormal dentition. Dental caries present. No dental abscesses or uvula swelling. No posterior oropharyngeal edema.    No sublingual swelling. Right upper, right lower, and left lower wisdom teeth are grown in but not left upper. Right lower first premolar with cavity and small hole in the lateral tooth. No surrounding erythema, edema, area of fluctuance. Frontal lower teeth with green discoloration  at the base of teeth with surrounding genital erythema, mild edema and minimal bleeding noted.  Eyes: Conjunctivae are normal.  Neck: Trachea normal, normal range of motion and full passive range of motion without pain. Neck supple.  Pulmonary/Chest: Effort normal. No respiratory distress.  Musculoskeletal: Normal range of motion.  Lymphadenopathy:    He has no cervical adenopathy.  Neurological: He is  alert. Coordination normal.  Skin: Skin is warm and dry. He is not diaphoretic.  Psychiatric: He has a normal mood and affect. His behavior is normal.  Nursing note and vitals reviewed.   ED Treatments / Results  Labs (all labs ordered are listed, but only abnormal results are displayed) Labs Reviewed - No data to display  EKG  EKG Interpretation None       Radiology No results found.  Procedures Procedures (including critical care time)  DIAGNOSTIC STUDIES: Oxygen Saturation is 98% on RA, normal by my interpretation.    COORDINATION OF CARE: 6:43 PM Discussed treatment plan with pt at bedside and pt agreed to plan. Medications Ordered in ED Medications - No data to display   Initial Impression / Assessment and Plan / ED Course  I have reviewed the triage vital signs and the nursing notes.  Pertinent labs & imaging results that were available during my care of the patient were reviewed by me and considered in my medical decision making (see chart for details).  Clinical Course   Patient with dentalgia.  No abscess requiring immediate incision and drainage.  Exam not concerning for Ludwig's angina or pharyngeal abscess. Patient afebrile, VSS, no acute distress, and well-appearing. Will treat with penicillin. Pt instructed to follow-up with dentist, Numerous resources given in discharge paperwork.  Discussed return precautions. Pt safe for discharge.  I personally performed the services described in this documentation, which was scribed in my presence. The recorded information has been reviewed and is accurate.   Final Clinical Impressions(s) / ED Diagnoses   Final diagnoses:  Pain, dental  Dental caries    New Prescriptions New Prescriptions   PENICILLIN V POTASSIUM (VEETID) 500 MG TABLET    Take 1 tablet (500 mg total) by mouth 4 (four) times daily.     Jerre Simon, PA 08/08/16 1858    Glynn Octave, MD 08/08/16 [id number]

## 2016-08-08 NOTE — ED Triage Notes (Signed)
Pt is here with mouth pain from grown in wisdom teeth and cavity.

## 2016-08-25 ENCOUNTER — Emergency Department (HOSPITAL_COMMUNITY)
Admission: EM | Admit: 2016-08-25 | Discharge: 2016-08-25 | Disposition: A | Discharge status: Home / Self Care | Payer: Self-pay | Attending: Emergency Medicine | Admitting: Emergency Medicine

## 2016-08-25 ENCOUNTER — Encounter (HOSPITAL_COMMUNITY): Payer: Self-pay

## 2016-08-25 DIAGNOSIS — K029 Dental caries, unspecified: Secondary | ICD-10-CM | POA: Insufficient documentation

## 2016-08-25 DIAGNOSIS — K0401 Reversible pulpitis: Secondary | ICD-10-CM | POA: Insufficient documentation

## 2016-08-25 DIAGNOSIS — E119 Type 2 diabetes mellitus without complications: Secondary | ICD-10-CM | POA: Insufficient documentation

## 2016-08-25 MED ORDER — AMOXICILLIN 500 MG PO CAPS: 500.0000 mg | ORAL_CAPSULE | Freq: Three times a day (TID) | ORAL | 0 refills | Status: DC

## 2016-08-25 MED ORDER — HYDROCODONE-ACETAMINOPHEN 5-325 MG PO TABS: 1.0000 | ORAL_TABLET | ORAL | 0 refills | Status: DC | PRN

## 2016-08-25 NOTE — ED Provider Notes (Signed)
[facility]EM[facility]Y DEPT Provider Note   CSN: [id number] Arrival date & time: 08/25/16  1445   By signing my name below, I, Teofilo PodMatthew P. Jamison, attest that this documentation has been prepared under the direction and in the presence of Arthor CaptainAbigail Pearley Baranek, PA-C. Electronically Signed: Teofilo PodMatthew P. Jamison, ED Scribe. 08/25/2016. 3:46 PM.   History   Chief Complaint No chief complaint on file.   The history is provided by the patient. No language interpreter was used.   HPI Comments:  Alan Burke is a 25 y.o. male with PMHx of DM who presents to the Emergency Department complaining of increasing lower left dental pain x 1 week. Pt reports that Pt has been taking OTC medication with no relief. Pt denies trouble swallowing, fever.   Past Medical History:  Diagnosis Date  . Diabetes mellitus without complication (HCC)     There are no active problems to display for this patient.   History reviewed. No pertinent surgical history.     Home Medications    Prior to Admission medications   Medication Sig Start Date End Date Taking? Authorizing Provider  clotrimazole (LOTRIMIN) 1 % cream Apply to affected area 2 times daily 10/02/15   Joycie PeekBenjamin Cartner, PA-C    Family History No family history on file.  Social History Social History  Substance Use Topics  . Smoking status: Never Smoker  . Smokeless tobacco: Never Used  . Alcohol use No     Allergies   Patient has no known allergies.   Review of Systems Review of Systems  Constitutional: Negative for fever.  HENT: Positive for dental problem. Negative for trouble swallowing.      Physical Exam Updated Vital Signs BP 123/77 (BP Location: Left Arm)   Pulse 88   Temp 97.8 F (36.6 C) (Oral)   Resp 20   SpO2 98%   Physical Exam Physical Exam  Constitutional: Pt appears well-developed and well-nourished.  HENT:  Head: Normocephalic.  Right Ear: Tympanic membrane, external ear and ear canal normal.  Left Ear:  Tympanic membrane, external ear and ear canal normal.  Nose: Nose normal. Right sinus exhibits no maxillary sinus tenderness and no frontal sinus tenderness. Left sinus exhibits no maxillary sinus tenderness and no frontal sinus tenderness.  Mouth/Throat: Open pulp in left lower wisdom tooth. Uvula is midline, oropharynx is clear and moist and mucous membranes are normal. No oral lesions. Abnormal dentition. Dental caries present. No uvula swelling or lacerations. No oropharyngeal exudate, posterior oropharyngeal edema, posterior oropharyngeal erythema or tonsillar abscesses.  No gingival swelling, fluctuance or induration No gross abscess  Eyes: Conjunctivae are normal. Pupils are equal, round, and reactive to light. Right eye exhibits no discharge. Left eye exhibits no discharge.  Neck: Normal range of motion. Neck supple.  No stridor Handling secretions without difficulty No nuchal rigidity No cervical lymphadenopathy   Cardiovascular: Normal rate, regular rhythm and normal heart sounds.   Pulmonary/Chest: Effort normal. No respiratory distress.  Equal chest rise  Abdominal: Soft. Bowel sounds are normal. Pt exhibits no distension. There is no tenderness.  Lymphadenopathy:    Pt has no cervical adenopathy.  Neurological: Pt is alert.  Skin: Skin is warm and dry.  Psychiatric: Pt has a normal mood and affect.  Nursing note and vitals reviewed.    ED Treatments / Results  DIAGNOSTIC STUDIES:  Oxygen Saturation is 98% on RA, normal by my interpretation.    COORDINATION OF CARE:  3:46 PM Discussed treatment plan with pt at bedside and pt  agreed to plan.   Labs (all labs ordered are listed, but only abnormal results are displayed) Labs Reviewed - No data to display  EKG  EKG Interpretation None       Radiology No results found.  Procedures Procedures (including critical care time)  Medications Ordered in ED Medications - No data to display   Initial Impression /  Assessment and Plan / ED Course  Patient with dentalgia.  No abscess requiring immediate incision and drainage.  Exam not concerning for Ludwig's angina or pharyngeal abscess.  Will treat with norco/amoxil. Pt instructed to follow-up with dentist.  Discussed return precautions. Pt safe for discharge.   I have reviewed the triage vital signs and the nursing notes.  Pertinent labs & imaging results that were available during my care of the patient were reviewed by me and considered in my medical decision making (see chart for details).  Clinical Course       Final Clinical Impressions(s) / ED Diagnoses   Final diagnoses:  None    New Prescriptions New Prescriptions   No medications on file  I personally performed the services described in this documentation, which was scribed in my presence. The recorded information has been reviewed and is accurate.       Arthor Captainbigail Shonika Kolasinski, PA-C 08/25/16 1558    Benjiman CoreNathan Pickering, MD 08/26/16 [phone]

## 2016-08-25 NOTE — Discharge Instructions (Signed)
[employer]  °[facility] of [facility]  °[facility]  °[address]  °[place], [place] [address]  °Phone [phone]  °[facility] [employer] [facility] in [place], [place], exemplifies the Dental School’s vision to improve the health and quality of life of all [place] by creating leaders with a passion to care for the underserved and by leading the nation in community-based, service learning oral health education. °We are committed to offering comprehensive general dental services for adults, children and special needs patients in a safe, caring and professional setting. ° °Appointments: Our clinic is open Monday through Friday 8:00 a.m. until 5:00 p.m. The amount of time scheduled for an appointment depends on the patient’s specific needs. We ask that you keep your appointed time for care or provide 24-hour notice of all appointment changes. Parents or legal guardians must accompany minor children. ° °Payment for Services: Medicaid and other insurance plans are welcome. Payment for services is due when services are rendered and may be made by cash or credit card. If you have dental insurance, we will assist you with your claim submission.  ° °Emergencies: Emergency services will be provided Monday through Friday on a walk-in basis. Please arrive early for emergency services. After hours emergency services will be provided for patients of record as required. ° °Services:  °Comprehensive General Dentistry  °Children’s Dentistry  °Oral Surgery - Extractions  °Root Canals  °Sealants and Tooth Colored Fillings  °Crowns and Bridges  °Dentures and Partial Dentures  °Implant Services  °Periodontal Services and Cleanings  °Cosmetic Tooth Whitening  °Digital Radiography  °3-D/[facility] Beam Imaging ° ° °

## 2016-08-25 NOTE — ED Triage Notes (Signed)
Patient complains of increasing lower left dental pain x 1 week. States taking otc meds with minimal relief.

## 2017-03-08 ENCOUNTER — Encounter (HOSPITAL_COMMUNITY): Payer: Self-pay

## 2017-03-08 ENCOUNTER — Emergency Department (HOSPITAL_COMMUNITY)
Admission: EM | Admit: 2017-03-08 | Discharge: 2017-03-08 | Disposition: A | Discharge status: Home / Self Care | Payer: Self-pay | Attending: Emergency Medicine | Admitting: Emergency Medicine

## 2017-03-08 DIAGNOSIS — Z792 Long term (current) use of antibiotics: Secondary | ICD-10-CM | POA: Insufficient documentation

## 2017-03-08 DIAGNOSIS — E119 Type 2 diabetes mellitus without complications: Secondary | ICD-10-CM | POA: Insufficient documentation

## 2017-03-08 DIAGNOSIS — R21 Rash and other nonspecific skin eruption: Secondary | ICD-10-CM | POA: Insufficient documentation

## 2017-03-08 DIAGNOSIS — Z79899 Other long term (current) drug therapy: Secondary | ICD-10-CM | POA: Insufficient documentation

## 2017-03-08 LAB — URINALYSIS, ROUTINE W REFLEX MICROSCOPIC
BACTERIA UA: NONE SEEN
Bilirubin Urine: NEGATIVE
Hgb urine dipstick: NEGATIVE
Ketones, ur: NEGATIVE mg/dL
Leukocytes, UA: NEGATIVE
Nitrite: NEGATIVE
PH: 6 (ref 5.0–8.0)
PROTEIN: NEGATIVE mg/dL
SQUAMOUS EPITHELIAL / LPF: NONE SEEN
Specific Gravity, Urine: 1.031 — ABNORMAL HIGH (ref 1.005–1.030)
WBC, UA: NONE SEEN WBC/hpf (ref 0–5)

## 2017-03-08 NOTE — ED Provider Notes (Signed)
[facility] Provider Note   CSN: [id number] Arrival date & time: 03/08/17  2008  By signing my name below, I, Alan Burke, attest that this documentation has been prepared under the direction and in the presence of Arthor Captain, PA-C. Electronically Signed: Elsie Burke, ED Scribe. 03/08/2017. 10:03 PM.  History   Chief Complaint Chief Complaint  Patient presents with  . Exposure to STD   HPI Comments: Alan Burke is a 26 y.o. male with a h/o DM, who presents to the Emergency Department complaining of unchanged, mildly pruritic rash onset one week ago. His rash is non-painful. Per pt, he began having sexual intercourse without barrier protection with a new partner recently. He is currently only with one male partner. His rash began shortly following him having unprotected sex with this partner. He states that she has recently been tested which was benign, but he has been worried and is requesting testing despite this. He denies dysuria, penile discharge, testicle pain, or any other associated symptoms.  The history is provided by the patient. No language interpreter was used.   Past Medical History:  Diagnosis Date  . Diabetes mellitus without complication (HCC)    There are no active problems to display for this patient.  History reviewed. No pertinent surgical history.  Home Medications    Prior to Admission medications   Medication Sig Start Date End Date Taking? Authorizing Provider  amoxicillin (AMOXIL) 500 MG capsule Take 1 capsule (500 mg total) by mouth 3 (three) times daily. 08/25/16   Arthor Captain, PA-C  clotrimazole (LOTRIMIN) 1 % cream Apply to affected area 2 times daily 10/02/15   Cartner, Sharlet Salina, PA-C  HYDROcodone-acetaminophen (NORCO) 5-325 MG tablet Take 1-2 tablets by mouth every 4 (four) hours as needed. 08/25/16   Arthor Captain, PA-C   Family History History reviewed. No pertinent family history.  Social History Social History    Substance Use Topics  . Smoking status: Never Smoker  . Smokeless tobacco: Never Used  . Alcohol use No   Allergies   Patient has no known allergies.  Review of Systems Review of Systems  Constitutional: Negative for fever.  Genitourinary: Negative for discharge, dysuria and testicular pain.  Skin: Positive for rash.   Physical Exam Updated Vital Signs BP 132/62 (BP Location: Right Arm)   Pulse 88   Temp 98 F (36.7 C) (Oral)   Resp 16   Ht 5\' 8"  (1.727 m)   Wt 70.3 kg (155 lb)   SpO2 100%   BMI 23.57 kg/m   Physical Exam  Constitutional: He appears well-developed and well-nourished. No distress.  HENT:  Head: Normocephalic and atraumatic.  Eyes: Conjunctivae are normal.  Neck: Normal range of motion.  Cardiovascular: Normal rate.   Pulmonary/Chest: Effort normal.  Abdominal: He exhibits no distension.  Genitourinary: Testes normal. Circumcised.  Genitourinary Comments: Chaperone present throughout entire exam. Mild irritation of the skin of the groin, red hypertrophic hair bump to the proximal portion of the penis. No testicle pain.   Musculoskeletal: Normal range of motion.  Neurological: He is alert.  Skin: No pallor.  Psychiatric: He has a normal mood and affect. His behavior is normal.  Nursing note and vitals reviewed.  ED Treatments / Results  DIAGNOSTIC STUDIES: Oxygen Saturation is 100% on RA, normal by my interpretation.    COORDINATION OF CARE: 10:08 PM Discussed treatment plan with pt at bedside and pt agreed to plan.  Labs (all labs ordered are listed, but only abnormal results are displayed)  Labs Reviewed  URINALYSIS, ROUTINE W REFLEX MICROSCOPIC - Abnormal; Notable for the following:       Result Value   Color, Urine STRAW (*)    Specific Gravity, Urine 1.031 (*)    Glucose, UA >=500 (*)    All other components within normal limits  RPR  HIV ANTIBODY (ROUTINE TESTING)  GC/CHLAMYDIA PROBE AMP ([place]) NOT AT [facility]   EKG  EKG  Interpretation None      Radiology No results found.  Procedures Procedures   Medications Ordered in ED Medications - No data to display  Initial Impression / Assessment and Plan / ED Course  I have reviewed the triage vital signs and the nursing notes.  Pertinent labs & imaging results that were available during my care of the patient were reviewed by me and considered in my medical decision making (see chart for details).     Patient with out evidence of infection. I have low suspicion for STD. This appears to be irritation from shaving. Of note, the patient did have glucose in his urine. I discussed with the patient to the follow-up for repeat UA and/or blood tests. That this may be an indicator of early diabetes. It certainly indicates a high blood sugar. The patient expresses her understanding and agrees with plan of care.  Final Clinical Impressions(s) / ED Diagnoses   Final diagnoses:  Rash and nonspecific skin eruption   New Prescriptions Discharge Medication List as of 03/08/2017 11:10 PM      I personally performed the services described in this documentation, which was scribed in my presence. The recorded information has been reviewed and is accurate.       Arthor CaptainHarris, Tanishi Nault, PA-C 03/09/17 1351    Derwood KaplanNanavati, Ankit, MD 03/12/17 2152

## 2017-03-08 NOTE — ED Triage Notes (Signed)
Pt complaining rash to R groin area. Pt states new sexual partner. Pt denies any testicular pain, or discharge.

## 2017-03-08 NOTE — Discharge Instructions (Signed)
Free HIV and STD Testing °These locations offer FREE confidential testing for HIV, Chlamydia, Gonorrhea, and Syphilis. °Non-Traditional Testing Sites Address Telephone ° °[facility] [address], °[place] °[phone] °Mondays 5pm - 7pm ° °[facility] °[address], [address] °[place] °[phone] °Wednesdays 2pm-8pm ° °[facility] °[address], °[place] °[phone] °Thursdays 9am-12noon °1pm-4pm ° °[facility] °[facility] °[address], [place] °[phone] °Tuesdays °Thursdays °9am-12noon °1pm-4pm ° °[facility] offers free, confidential testing and treatment for HIV, Chlamydia, Gonorrhea, Syphilis, Herpes, Bacterial Vaginosis, Yeast, and Trichomoniasis. °Traditional Testing ° ° °[facility] °[address], °[place] °[phone]  °Monday thru Friday  °Call for an appointment ° °[facility] [place] °[facility] °[address] °[place] °[phone] °Monday thru Friday  °Call for anappointment. ° °If you have any questions about this information please call [phone]. °08/23/2011 ° °

## 2017-03-09 LAB — RPR: RPR: NONREACTIVE

## 2017-03-11 LAB — HIV ANTIBODY (ROUTINE TESTING W REFLEX): HIV SCREEN 4TH GENERATION: NONREACTIVE

## 2017-05-24 ENCOUNTER — Encounter (HOSPITAL_COMMUNITY): Payer: Self-pay | Admitting: Emergency Medicine

## 2017-05-24 DIAGNOSIS — Z5321 Procedure and treatment not carried out due to patient leaving prior to being seen by health care provider: Secondary | ICD-10-CM | POA: Insufficient documentation

## 2017-05-24 DIAGNOSIS — K0889 Other specified disorders of teeth and supporting structures: Secondary | ICD-10-CM | POA: Insufficient documentation

## 2017-05-24 MED ORDER — OXYCODONE-ACETAMINOPHEN 5-325 MG PO TABS
1.0000 | ORAL_TABLET | Freq: Once | ORAL | Status: AC
  Administered 2017-05-24: 1 via ORAL

## 2017-05-24 MED ORDER — OXYCODONE-ACETAMINOPHEN 5-325 MG PO TABS
ORAL_TABLET | ORAL | Status: AC
  Filled 2017-05-24: qty 1

## 2017-05-24 NOTE — ED Triage Notes (Signed)
Patient reports persistent left/right , upper/lower molars pain for several weeks unrelieved by OTC pain medications .

## 2017-05-25 ENCOUNTER — Emergency Department (HOSPITAL_COMMUNITY)
Admission: EM | Admit: 2017-05-25 | Discharge: 2017-05-25 | Disposition: A | Discharge status: Home / Self Care | Payer: Self-pay | Attending: Emergency Medicine | Admitting: Emergency Medicine

## 2017-05-25 NOTE — ED Notes (Signed)
Patient up to desk cursing at staff, stating he was displeased with wait time.  Patient gave stickers and BP cuff to this RN and states he was leaving.

## 2017-10-03 ENCOUNTER — Other Ambulatory Visit: Payer: Self-pay

## 2017-10-03 ENCOUNTER — Encounter (HOSPITAL_COMMUNITY): Payer: Self-pay | Admitting: Emergency Medicine

## 2017-10-03 ENCOUNTER — Emergency Department (HOSPITAL_COMMUNITY)
Admission: EM | Admit: 2017-10-03 | Discharge: 2017-10-03 | Disposition: A | Discharge status: Home / Self Care | Payer: Self-pay | Attending: Physician Assistant | Admitting: Physician Assistant

## 2017-10-03 DIAGNOSIS — Z794 Long term (current) use of insulin: Secondary | ICD-10-CM | POA: Insufficient documentation

## 2017-10-03 DIAGNOSIS — E119 Type 2 diabetes mellitus without complications: Secondary | ICD-10-CM | POA: Insufficient documentation

## 2017-10-03 DIAGNOSIS — R51 Headache: Secondary | ICD-10-CM | POA: Insufficient documentation

## 2017-10-03 DIAGNOSIS — K0889 Other specified disorders of teeth and supporting structures: Secondary | ICD-10-CM | POA: Insufficient documentation

## 2017-10-03 LAB — CBG MONITORING, ED: GLUCOSE-CAPILLARY: 326 mg/dL — AB (ref 65–99)

## 2017-10-03 MED ORDER — AMOXICILLIN 500 MG PO CAPS: 500.0000 mg | ORAL_CAPSULE | Freq: Three times a day (TID) | ORAL | 0 refills | Status: DC

## 2017-10-03 MED ORDER — MELOXICAM 15 MG PO TABS
15.0000 mg | ORAL_TABLET | Freq: Every day | ORAL | 0 refills | Status: DC
Start: 2017-10-03 — End: 2020-09-12

## 2017-10-03 MED ORDER — OXYCODONE-ACETAMINOPHEN 5-325 MG PO TABS
2.0000 | ORAL_TABLET | Freq: Once | ORAL | Status: AC
  Administered 2017-10-03: 2 via ORAL
  Filled 2017-10-03: qty 2

## 2017-10-03 MED ORDER — ONDANSETRON 4 MG PO TBDP
4.0000 mg | ORAL_TABLET | Freq: Once | ORAL | Status: AC
  Administered 2017-10-03: 4 mg via ORAL
  Filled 2017-10-03: qty 1

## 2017-10-03 MED FILL — MELOXICAM 15 MG TABLET: 15 | 30 days supply | Qty: 30 | Fill #0

## 2017-10-03 MED FILL — AMOXICILLIN 500 MG CAPSULE: 500 | 7 days supply | Qty: 21 | Fill #0

## 2017-10-03 NOTE — Discharge Instructions (Signed)
[employer]  °[facility] of [facility] [employer]  °[facility]  °[address]  °[place], [place] [address]  °Phone [phone]  °The [employer] [facility] in [place], [place], exemplifies the Dental School’s vision to improve the health and quality of life of all [place] by creating leaders with a passion to care for the underserved and by leading the nation in community-based, service learning oral health education. °We are committed to offering comprehensive general dental services for adults, children and special needs patients in a safe, caring and professional setting. ° °Appointments: Our clinic is open Monday through Friday 8:00 a.m. until 5:00 p.m. The amount of time scheduled for an appointment depends on the patient’s specific needs. We ask that you keep your appointed time for care or provide 24-hour notice of all appointment changes. Parents or legal guardians must accompany minor children. ° °Payment for Services: Medicaid and other insurance plans are welcome. Payment for services is due when services are rendered and may be made by cash or credit card. If you have dental insurance, we will assist you with your claim submission.  ° °Emergencies: Emergency services will be provided Monday through Friday on a walk-in basis. Please arrive early for emergency services. After hours emergency services will be provided for patients of record as required. ° °Services:  °Comprehensive General Dentistry  °Children’s Dentistry  °Oral Surgery - Extractions  °Root Canals  °Sealants and Tooth Colored Fillings  °Crowns and Bridges  °Dentures and Partial Dentures  °Implant Services  °Periodontal Services and Cleanings  °Cosmetic Tooth Whitening  °Digital Radiography  °3-D/[facility] Beam Imaging ° ° °

## 2017-10-03 NOTE — Discharge Planning (Signed)
Tahara Ruffini J. Lucretia RoersWood, RN, BSN, [place] [phone]  [facility] Messingoger Gomez[facility] 1/[facility] @0900 .  Spoke with pt at bedside and advised to please arrive 15 min early and take a picture ID and your current medications.  Pt verbalizes understanding of keeping appointment.

## 2017-10-03 NOTE — ED Notes (Signed)
Pt reports took scheduled 40 units Novolin prior to arrival to ED. CBG 326.

## 2017-10-03 NOTE — ED Triage Notes (Signed)
Pt states he has 10/10 dental pain for the past few days, he states he has multiple broken teeth.

## 2017-10-03 NOTE — ED Provider Notes (Signed)
[facility] Provider Note   CSN: [id number] Arrival date & time: 10/03/17  [id number]     History   Chief Complaint Chief Complaint  Patient presents with  . Dental Pain    HPI Arva ChafeMarquis Levels is a 26 y.o. male who presents with cc of pain in his molars. He rates in at 10/10 with pain into his sinuses and throbbing headache. He has been taking otc meds without relief. He has sought treatment at the dental school but was told he would need a referral and abx pt tx. Patient states that his pain is severe and he doesn't know what he will do until then . He says that he isn't working and doesn't know how he will come up with the money. He denies fevers, chills, changes in voice or pain with swallowing.  HPI  Past Medical History:  Diagnosis Date  . Diabetes mellitus without complication (HCC)     There are no active problems to display for this patient.   History reviewed. No pertinent surgical history.     Home Medications    Prior to Admission medications   Medication Sig Start Date End Date Taking? Authorizing Provider  aspirin-acetaminophen-caffeine (EXCEDRIN MIGRAINE) [phone] MG tablet Take 2 tablets by mouth every 6 (six) hours as needed for headache.   Yes [provider]  insulin NPH Human (HUMULIN N,NOVOLIN N) 100 UNIT/ML injection Inject 40 Units into the skin 2 (two) times daily before a meal.   Yes [provider]  amoxicillin (AMOXIL) 500 MG capsule Take 1 capsule (500 mg total) by mouth 3 (three) times daily. 10/03/17   Arthor CaptainHarris, Samra Pesch, PA-C  HYDROcodone-acetaminophen (NORCO) 5-325 MG tablet Take 1-2 tablets by mouth every 4 (four) hours as needed. Patient not taking: Reported on 10/03/2017 08/25/16   Arthor CaptainHarris, Rand Boller, PA-C  meloxicam (MOBIC) 15 MG tablet Take 1 tablet (15 mg total) by mouth daily. 10/03/17   Arthor CaptainHarris, Cindel Daugherty, PA-C    Family History No family history on file.  Social History Social History     Tobacco Use  . Smoking status: Never Smoker  . Smokeless tobacco: Never Used  Substance Use Topics  . Alcohol use: No  . Drug use: No     Allergies   Patient has no known allergies.   Review of Systems Review of Systems Ten systems reviewed and are negative for acute change, except as noted in the HPI.    Physical Exam Updated Vital Signs BP (!) 128/92 (BP Location: Right Arm)   Pulse 67   Temp 98.5 F (36.9 C) (Oral)   Resp 20   Ht 5\' 8"  (1.727 m)   Wt 70.3 kg (155 lb)   SpO2 99%   BMI 23.57 kg/m   Physical Exam  Constitutional: He appears well-developed and well-nourished. No distress.  HENT:  Head: Normocephalic and atraumatic.  Mouth/Throat: Uvula is midline. No trismus in the jaw. Dental caries present.  Few dental carries. Overall dentition appears good.  Eyes: Conjunctivae are normal. No scleral icterus.  Neck: Normal range of motion. Neck supple.  Cardiovascular: Normal rate, regular rhythm and normal heart sounds.  Pulmonary/Chest: Effort normal and breath sounds normal. No respiratory distress.  Abdominal: Soft. There is no tenderness.  Musculoskeletal: He exhibits no edema.  Neurological: He is alert.  Skin: Skin is warm and dry. He is not diaphoretic.  Psychiatric: His behavior is normal.  Nursing note and vitals reviewed.    ED Treatments / Results  Labs (  all labs ordered are listed, but only abnormal results are displayed) Labs Reviewed  CBG MONITORING, ED - Abnormal; Notable for the following components:      Result Value   Glucose-Capillary 326 (*)    All other components within normal limits    EKG  EKG Interpretation None       Radiology No results found.  Procedures Procedures (including critical care time)  Medications Ordered in ED Medications  oxyCODONE-acetaminophen (PERCOCET/ROXICET) 5-325 MG per tablet 2 tablet (2 tablets Oral Given 10/03/17 0922)  ondansetron (ZOFRAN-ODT) disintegrating tablet 4 mg (4 mg Oral  Given 10/03/17 [id number])     Initial Impression / Assessment and Plan / ED Course  I have reviewed the triage vital signs and the nursing notes.  Pertinent labs & imaging results that were available during my care of the patient were reviewed by me and considered in my medical decision making (see chart for details).  Clinical Course as of Oct 03 1052  Thu Oct 03, 2017  1052 Patient with dental pain.  [facility]  [facility] will also [facility]  I have given him a direct referral to [employer] [facility].  I explained that we not be able to manage chronic pain this is something he needs to take care of.  I do not see any overt signs of infection at this time however will treat with amoxicillin given the fact that he does have some dental caries.  Patient is advised to follow-up as directed.  He may return for any new or worsening symptoms.  [AH]    Clinical Course User Index [AH] Arthor CaptainHarris, Jaynee Winters, PA-C      Final Clinical Impressions(s) / ED Diagnoses   Final diagnoses:  Pain, dental    ED Discharge Orders        Ordered    amoxicillin (AMOXIL) 500 MG capsule  3 times daily     10/03/17 1008    meloxicam (MOBIC) 15 MG tablet  Daily     10/03/17 1008       Arthor CaptainHarris, Pantera Winterrowd, PA-C 10/03/17 1053    [place], Cindee Saltourteney Lyn, MD 10/13/17 2100

## 2017-10-21 ENCOUNTER — Ambulatory Visit (INDEPENDENT_AMBULATORY_CARE_PROVIDER_SITE_OTHER): Payer: Self-pay | Admitting: Physician Assistant

## 2017-10-23 ENCOUNTER — Ambulatory Visit (INDEPENDENT_AMBULATORY_CARE_PROVIDER_SITE_OTHER): Payer: Self-pay | Admitting: Physician Assistant

## 2018-09-16 ENCOUNTER — Encounter (HOSPITAL_COMMUNITY): Payer: Self-pay | Admitting: *Deleted

## 2018-09-16 ENCOUNTER — Other Ambulatory Visit: Payer: Self-pay

## 2018-09-16 ENCOUNTER — Emergency Department (HOSPITAL_COMMUNITY)
Admission: EM | Admit: 2018-09-16 | Discharge: 2018-09-16 | Disposition: A | Discharge status: Home / Self Care | Payer: Self-pay | Attending: Emergency Medicine | Admitting: Emergency Medicine

## 2018-09-16 DIAGNOSIS — E119 Type 2 diabetes mellitus without complications: Secondary | ICD-10-CM | POA: Insufficient documentation

## 2018-09-16 DIAGNOSIS — K0889 Other specified disorders of teeth and supporting structures: Secondary | ICD-10-CM | POA: Insufficient documentation

## 2018-09-16 DIAGNOSIS — Z79899 Other long term (current) drug therapy: Secondary | ICD-10-CM | POA: Insufficient documentation

## 2018-09-16 MED ORDER — NAPROXEN 500 MG PO TABS: 500.0000 mg | ORAL_TABLET | Freq: Two times a day (BID) | ORAL | 0 refills | Status: DC

## 2018-09-16 MED ORDER — LIDOCAINE VISCOUS HCL 2 % MT SOLN
15.0000 mL | Freq: Once | OROMUCOSAL | Status: AC
  Administered 2018-09-16: 15 mL via OROMUCOSAL
  Filled 2018-09-16: qty 15

## 2018-09-16 MED ORDER — PENICILLIN V POTASSIUM 500 MG PO TABS: 500.0000 mg | ORAL_TABLET | Freq: Four times a day (QID) | ORAL | 0 refills | Status: DC

## 2018-09-16 MED ORDER — PENICILLIN V POTASSIUM 500 MG PO TABS
500.0000 mg | ORAL_TABLET | Freq: Once | ORAL | Status: AC
  Administered 2018-09-16: 500 mg via ORAL
  Filled 2018-09-16: qty 1

## 2018-09-16 MED ORDER — NAPROXEN 500 MG PO TABS
500.0000 mg | ORAL_TABLET | Freq: Once | ORAL | Status: AC
  Administered 2018-09-16: 500 mg via ORAL
  Filled 2018-09-16: qty 1

## 2018-09-16 MED ORDER — LIDOCAINE VISCOUS HCL 2 % MT SOLN: 15.0000 mL | OROMUCOSAL | 0 refills | Status: DC | PRN

## 2018-09-16 NOTE — ED Provider Notes (Signed)
[facility]-EMERGENCY DEPT Provider Note   CSN: [id number] Arrival date & time: 09/16/18  0026     History   Chief Complaint Chief Complaint  Patient presents with  . Dental Pain    HPI Alan Burke is a 27 y.o. male.  The history is provided by the patient and medical records.  Dental Pain       27 y.o. M with hx of DM, presenting to the ED for dental pain.  Patient reports he has been having dental issues for months but getting worse lately.  He reports several of his right lower teeth are broken and decayed, breaking off more and more whenever he eats.  He has history of same on left, [facility].  States he does not currently have any dental insurance until Jan 1st, 2020 so he called the school and they told him to come here and get started on antibiotics.  He denies any fever or difficulty swallowing.  No neck pain or swelling.  Has used OTC meds as well as orajel without relief.  Past Medical History:  Diagnosis Date  . Diabetes mellitus without complication (HCC)     There are no active problems to display for this patient.   History reviewed. No pertinent surgical history.      Home Medications    Prior to Admission medications   Medication Sig Start Date End Date Taking? Authorizing Provider  amoxicillin (AMOXIL) 500 MG capsule Take 1 capsule (500 mg total) by mouth 3 (three) times daily. 10/03/17   Arthor CaptainHarris, Abigail, PA-C  aspirin-acetaminophen-caffeine (EXCEDRIN MIGRAINE) [phone] MG tablet Take 2 tablets by mouth every 6 (six) hours as needed for headache.    [provider]  HYDROcodone-acetaminophen (NORCO) 5-325 MG tablet Take 1-2 tablets by mouth every 4 (four) hours as needed. Patient not taking: Reported on 10/03/2017 08/25/16   Arthor CaptainHarris, Abigail, PA-C  insulin NPH Human (HUMULIN N,NOVOLIN N) 100 UNIT/ML injection Inject 40 Units into the skin 2 (two) times daily before a meal.    [provider]  meloxicam (MOBIC) 15 MG tablet Take 1 tablet (15 mg total) by mouth daily. 10/03/17   Arthor CaptainHarris, Abigail, PA-C    Family History No family history on file.  Social History Social History   Tobacco Use  . Smoking status: Never Smoker  . Smokeless tobacco: Never Used  Substance Use Topics  . Alcohol use: No  . Drug use: No     Allergies   Patient has no known allergies.   Review of Systems Review of Systems  HENT: Positive for dental problem.   All other systems reviewed and are negative.    Physical Exam Updated Vital Signs BP (!) 144/94 (BP Location: Left Arm)   Pulse 66   Temp 98.1 F (36.7 C) (Oral)   Resp 17   Ht 5\' 8"  (1.727 m)   Wt 79.4 kg   SpO2 98%   BMI 26.61 kg/m   Physical Exam  Constitutional: He is oriented to person, place, and time. He appears well-developed and well-nourished.  HENT:  Head: Normocephalic and atraumatic.  Mouth/Throat: Oropharynx is clear and moist.  Teeth largely in poor dentition, right lower lateral incisor is broken and decayed down to the gums along with right lower 2nd molar, surrounding gingiva irritated without abscess formation, handling secretions appropriately, no trismus, no facial or neck swelling, normal phonation without stridor  Eyes: Pupils are equal, round, and reactive to light. Conjunctivae  and EOM are normal.  Neck: Normal range of motion.  Cardiovascular: Normal rate, regular rhythm and normal heart sounds.  Pulmonary/Chest: Effort normal and breath sounds normal. No stridor. No respiratory distress.  Abdominal: Soft. Bowel sounds are normal. There is no tenderness. There is no rebound.  Musculoskeletal: Normal range of motion.  Neurological: He is alert and oriented to person, place, and time.  Skin: Skin is warm and dry.  Psychiatric: He has a normal mood and affect.  Nursing note and vitals reviewed.    ED Treatments / Results  Labs (all labs ordered are listed, but only abnormal  results are displayed) Labs Reviewed - No data to display  EKG None  Radiology No results found.  Procedures Procedures (including critical care time)  Medications Ordered in ED Medications  penicillin v potassium (VEETID) tablet 500 mg (500 mg Oral Given 09/16/18 0232)  naproxen (NAPROSYN) tablet 500 mg (500 mg Oral Given 09/16/18 0232)  lidocaine (XYLOCAINE) 2 % viscous mouth solution 15 mL (15 mLs Mouth/Throat Given 09/16/18 0232)     Initial Impression / Assessment and Plan / ED Course  I have reviewed the triage vital signs and the nursing notes.  Pertinent labs & imaging results that were available during my care of the patient were reviewed by me and considered in my medical decision making (see chart for details).  27 year old male here with dental pain.  Has been ongoing issue over the past several months.  Was previously seen by [facility].  Does not have current dentist or dental insurance.  Has 2 broken teeth along the right lower, signs of decay but no abscess formation.  He has no significant facial or neck swelling, and in secretions well, normal phonation without stridor.  He has not had any signs or symptoms at this time suggestive of Ludwig's angina.  Will start on antibiotics and referred to dentist, resource guide provided has no dentist on call today.  He will return here for any new or worsening symptoms.  Final Clinical Impressions(s) / ED Diagnoses   Final diagnoses:  Pain, dental    ED Discharge Orders         Ordered    naproxen (NAPROSYN) 500 MG tablet  2 times daily with meals     09/16/18 0250    penicillin v potassium (VEETID) 500 MG tablet  4 times daily     09/16/18 0250    lidocaine (XYLOCAINE) 2 % solution  As needed     09/16/18 0250           Garlon Hatchet, PA-C 09/16/18 0302    Molpus, Jonny Ruiz, MD 09/16/18 [phone]

## 2018-09-16 NOTE — Discharge Instructions (Signed)
Take the prescribed medication as directed. Follow-up with dentist-- see resource guide to assist with finding local clinic. Return to the ED for new or worsening symptoms.

## 2018-09-16 NOTE — ED Triage Notes (Signed)
Pt c/o toothache.  Pt presents with decay to right lower back tooth.

## 2018-10-05 ENCOUNTER — Other Ambulatory Visit: Payer: Self-pay

## 2018-10-05 ENCOUNTER — Emergency Department (HOSPITAL_COMMUNITY)
Admission: EM | Admit: 2018-10-05 | Discharge: 2018-10-05 | Disposition: A | Discharge status: Home / Self Care | Payer: Self-pay | Attending: Emergency Medicine | Admitting: Emergency Medicine

## 2018-10-05 DIAGNOSIS — K0889 Other specified disorders of teeth and supporting structures: Secondary | ICD-10-CM | POA: Insufficient documentation

## 2018-10-05 DIAGNOSIS — Z79899 Other long term (current) drug therapy: Secondary | ICD-10-CM | POA: Insufficient documentation

## 2018-10-05 DIAGNOSIS — E119 Type 2 diabetes mellitus without complications: Secondary | ICD-10-CM | POA: Insufficient documentation

## 2018-10-05 DIAGNOSIS — Z794 Long term (current) use of insulin: Secondary | ICD-10-CM | POA: Insufficient documentation

## 2018-10-05 MED ORDER — IBUPROFEN 800 MG PO TABS: 800.0000 mg | ORAL_TABLET | Freq: Four times a day (QID) | ORAL | 0 refills | Status: DC | PRN

## 2018-10-05 MED ORDER — KETOROLAC TROMETHAMINE 60 MG/2ML IM SOLN
60.0000 mg | Freq: Once | INTRAMUSCULAR | Status: AC
  Administered 2018-10-05: 60 mg via INTRAMUSCULAR
  Filled 2018-10-05: qty 2

## 2018-10-05 MED ORDER — CLINDAMYCIN HCL 150 MG PO CAPS: 300.0000 mg | ORAL_CAPSULE | Freq: Four times a day (QID) | ORAL | 0 refills | Status: DC

## 2018-10-05 NOTE — Discharge Instructions (Addendum)
Continue to take Excedrin for pain.  Take ibuprofen 800 mg every 6 hours for additional pain relief.  Take clindamycin as prescribed until finished.  Follow-up with dentist or oral surgeon as referred

## 2018-10-05 NOTE — ED Provider Notes (Signed)
[facility]-EMERGENCY DEPT Provider Note   CSN: [id number] Arrival date & time: 10/05/18  1210     History   Chief Complaint Chief Complaint  Patient presents with  . Dental Pain    HPI Alan Burke is a 27 y.o. male.  HPI Alan Burke is a 27 y.o. male presents to emergency department complaint of dental pain.  Dental pain started 1 month ago.  It is on the right side, multiple teeth.  States right lower first molar broke off about a month ago and has been hurting since then.  He states any cold air, any food, any fluids hurt his tooth worse.  Nothing makes it better.  He was seen here 3 weeks ago and was given prescription for penicillin which he finished, as well as naproxen.  He states none of that helped.  He states he is currently taking Excedrin Migraine and using topical numbing gel as well as clove oil which is not helping.  He denies any fever or chills.  No facial swelling.  None swelling under the tongue.  No difficulty breathing or swallowing.  He states his insurance does not start until 1 January and he does not have insurance to see a Education officer, communitydentist, does not have any money out of pocket to pay.     Past Medical History:  Diagnosis Date  . Diabetes mellitus without complication (HCC)     There are no active problems to display for this patient.   No past surgical history on file.      Home Medications    Prior to Admission medications   Medication Sig Start Date End Date Taking? Authorizing Provider  amoxicillin (AMOXIL) 500 MG capsule Take 1 capsule (500 mg total) by mouth 3 (three) times daily. 10/03/17   Arthor CaptainHarris, Abigail, PA-C  aspirin-acetaminophen-caffeine (EXCEDRIN MIGRAINE) [phone] MG tablet Take 2 tablets by mouth every 6 (six) hours as needed for headache.    [provider]  HYDROcodone-acetaminophen (NORCO) 5-325 MG tablet Take 1-2 tablets by mouth every 4 (four) hours as needed. Patient not taking: Reported on  10/03/2017 08/25/16   Arthor CaptainHarris, Abigail, PA-C  insulin NPH Human (HUMULIN N,NOVOLIN N) 100 UNIT/ML injection Inject 40 Units into the skin 2 (two) times daily before a meal.    [provider]  lidocaine (XYLOCAINE) 2 % solution Use as directed 15 mLs in the mouth or throat as needed for mouth pain. Or can apply topically with q-tip/cotton ball. 09/16/18   Garlon HatchetSanders, Lisa M, PA-C  meloxicam (MOBIC) 15 MG tablet Take 1 tablet (15 mg total) by mouth daily. 10/03/17   Arthor CaptainHarris, Abigail, PA-C  naproxen (NAPROSYN) 500 MG tablet Take 1 tablet (500 mg total) by mouth 2 (two) times daily with a meal. 09/16/18   Garlon HatchetSanders, Lisa M, PA-C  penicillin v potassium (VEETID) 500 MG tablet Take 1 tablet (500 mg total) by mouth 4 (four) times daily. 09/16/18   Garlon HatchetSanders, Lisa M, PA-C    Family History No family history on file.  Social History Social History   Tobacco Use  . Smoking status: Never Smoker  . Smokeless tobacco: Never Used  Substance Use Topics  . Alcohol use: No  . Drug use: No     Allergies   Patient has no known allergies.   Review of Systems Review of Systems  Constitutional: Negative for chills and fever.  HENT: Positive for dental problem. Negative for trouble swallowing.   Respiratory: Negative for shortness of breath.   Neurological:  Positive for headaches.     Physical Exam Updated Vital Signs BP 126/87 (BP Location: Right Arm)   Pulse 89   Temp 98 F (36.7 C) (Oral)   Resp 18   Ht 5\' 8"  (1.727 m)   Wt 79.4 kg   SpO2 96%   BMI 26.61 kg/m   Physical Exam Vitals signs and nursing note reviewed.  Constitutional:      General: He is not in acute distress.    Appearance: He is well-developed.  HENT:     Head:     Comments: Multiple dental caries.  Right lower first molar broken off to the gumline, with minimal gum bleeding, minimal gum swelling.  No trismus.  No swelling under the tongue. Eyes:     Conjunctiva/sclera: Conjunctivae normal.  Neck:      Musculoskeletal: Neck supple.  Cardiovascular:     Rate and Rhythm: Normal rate.  Pulmonary:     Effort: No respiratory distress.  Abdominal:     General: There is no distension.  Skin:    General: Skin is warm and dry.      ED Treatments / Results  Labs (all labs ordered are listed, but only abnormal results are displayed) Labs Reviewed - No data to display  EKG None  Radiology No results found.  Procedures Procedures (including critical care time)  Medications Ordered in ED Medications  ketorolac (TORADOL) injection 60 mg (has no administration in time range)     Initial Impression / Assessment and Plan / ED Course  I have reviewed the triage vital signs and the nursing notes.  Pertinent labs & imaging results that were available during my care of the patient were reviewed by me and considered in my medical decision making (see chart for details).     Patient in emergency department with persistent and worsening dental pain for the last month, most likely coming from a broken off and decayed right lower first molar.  There is minimal gum swelling and some bleeding from the gum noted.  I am concerned about an infection.  There is no trismus or swelling under the tongue.  No difficulty swallowing or breathing.  I do not suspect Ludwig's angina at this time.  Patient was recently treated with penicillin which she states did not help.  I will switch him to clindamycin.  Continue Excedrin Migraine and I will start him on ibuprofen 800 mg.  I will refer her to an oral surgeon.  Patient agreed to the plan.  Discussed signs and symptoms that should prompt his return.  Vitals:   10/05/18 1220 10/05/18 1221  BP: 126/87   Pulse: 89   Resp: 18   Temp: 98 F (36.7 C)   TempSrc: Oral   SpO2: 96%   Weight:  79.4 kg  Height:  5\' 8"  (1.727 m)     Final Clinical Impressions(s) / ED Diagnoses   Final diagnoses:  Pain, dental    ED Discharge Orders         Ordered     clindamycin (CLEOCIN) 150 MG capsule  Every 6 hours     10/05/18 1250    ibuprofen (ADVIL,MOTRIN) 800 MG tablet  Every 6 hours PRN     10/05/18 1250           Jaynie CrumbleKirichenko, Meko Masterson, PA-C 10/05/18 1320    Shaune PollackIsaacs, Cameron, MD 10/06/18 0510

## 2018-10-05 NOTE — ED Triage Notes (Signed)
Pt c/o right lower tooth pain x1 month.  PT reports tooth broke last month, was seen here earlier this month and was given recommendation for dentist, but health insurance doesn't begin until new year.  Last visit, pt received antibiotics and took all of them.

## 2018-10-28 ENCOUNTER — Emergency Department (HOSPITAL_COMMUNITY): Payer: Self-pay

## 2018-10-28 ENCOUNTER — Other Ambulatory Visit: Payer: Self-pay

## 2018-10-28 ENCOUNTER — Emergency Department (HOSPITAL_COMMUNITY)
Admission: EM | Admit: 2018-10-28 | Discharge: 2018-10-28 | Disposition: A | Discharge status: Home / Self Care | Payer: Self-pay | Attending: Emergency Medicine | Admitting: Emergency Medicine

## 2018-10-28 DIAGNOSIS — E119 Type 2 diabetes mellitus without complications: Secondary | ICD-10-CM | POA: Insufficient documentation

## 2018-10-28 DIAGNOSIS — Z794 Long term (current) use of insulin: Secondary | ICD-10-CM | POA: Insufficient documentation

## 2018-10-28 DIAGNOSIS — Z79899 Other long term (current) drug therapy: Secondary | ICD-10-CM | POA: Insufficient documentation

## 2018-10-28 DIAGNOSIS — R0789 Other chest pain: Secondary | ICD-10-CM

## 2018-10-28 LAB — CBG MONITORING, ED: Glucose-Capillary: 194 mg/dL — ABNORMAL HIGH (ref 70–99)

## 2018-10-28 NOTE — ED Triage Notes (Signed)
Pt presents for evaluation of L sided intermittent chest pain ongoing for 2 months. Taking Tylenol with no relief at home. Endorses shob.

## 2018-10-28 NOTE — ED Provider Notes (Addendum)
[facility] EMERGENCY DEPARTMENT Provider Note   CSN: [id number] Arrival date & time: 10/28/18  1634     History   Chief Complaint Chief Complaint  Patient presents with  . Chest Pain    HPI Alan Burke is a 28 y.o. male.  28 year old male with prior medical history as detailed below presents for evaluation of intermittent chest pain.  Patient reports intermittent episodes of chest pain over the last 6 months.  Patient describes sharp intermittent right-sided chest pain.  This can be brought on by lifting of the right arm.  He describes intermittent left-sided chest discomfort that can be brought on by lying on his left side.  Patient is without complaint of current chest pain.  Patient denies prior history of CAD.  Patient denies associated fever, nausea, vomiting, shortness of breath, diaphoresis.  Patient's described symptoms are clearly exacerbated by movement of his arms and torso.  The history is provided by medical records and the patient.  Chest Pain  Pain location:  R lateral chest and L lateral chest Pain quality: sharp and shooting   Pain radiates to:  R shoulder and L shoulder Pain severity:  Mild Onset quality:  Sudden Duration:  6 months Timing:  Intermittent Progression:  Waxing and waning Chronicity:  New Context: movement   Relieved by:  Nothing Worsened by:  Nothing Ineffective treatments:  None tried   Past Medical History:  Diagnosis Date  . Diabetes mellitus without complication (HCC)     There are no active problems to display for this patient.   No past surgical history on file.      Home Medications    Prior to Admission medications   Medication Sig Start Date End Date Taking? Authorizing Provider  amoxicillin (AMOXIL) 500 MG capsule Take 1 capsule (500 mg total) by mouth 3 (three) times daily. 10/03/17   Arthor Captain, PA-C  aspirin-acetaminophen-caffeine (EXCEDRIN MIGRAINE) [phone] MG tablet Take 2 tablets by  mouth every 6 (six) hours as needed for headache.    [provider]  clindamycin (CLEOCIN) 150 MG capsule Take 2 capsules (300 mg total) by mouth every 6 (six) hours. 10/05/18   Kirichenko, Lemont Fillers, PA-C  HYDROcodone-acetaminophen (NORCO) 5-325 MG tablet Take 1-2 tablets by mouth every 4 (four) hours as needed. Patient not taking: Reported on 10/03/2017 08/25/16   Arthor Captain, PA-C  ibuprofen (ADVIL,MOTRIN) 800 MG tablet Take 1 tablet (800 mg total) by mouth every 6 (six) hours as needed. 10/05/18   Kirichenko, Tatyana, PA-C  insulin NPH Human (HUMULIN N,NOVOLIN N) 100 UNIT/ML injection Inject 40 Units into the skin 2 (two) times daily before a meal.    [provider]  lidocaine (XYLOCAINE) 2 % solution Use as directed 15 mLs in the mouth or throat as needed for mouth pain. Or can apply topically with q-tip/cotton ball. 09/16/18   Garlon Hatchet, PA-C  meloxicam (MOBIC) 15 MG tablet Take 1 tablet (15 mg total) by mouth daily. 10/03/17   Arthor Captain, PA-C  naproxen (NAPROSYN) 500 MG tablet Take 1 tablet (500 mg total) by mouth 2 (two) times daily with a meal. 09/16/18   Garlon Hatchet, PA-C  penicillin v potassium (VEETID) 500 MG tablet Take 1 tablet (500 mg total) by mouth 4 (four) times daily. 09/16/18   Garlon Hatchet, PA-C    Family History No family history on file.  Social History Social History   Tobacco Use  . Smoking status: Never Smoker  . Smokeless tobacco:  Never Used  Substance Use Topics  . Alcohol use: No  . Drug use: No     Allergies   Patient has no known allergies.   Review of Systems Review of Systems  Cardiovascular: Positive for chest pain.  All other systems reviewed and are negative.    Physical Exam Updated Vital Signs BP 108/65 (BP Location: Left Arm)   Pulse 74   Temp 98.5 F (36.9 C) (Oral)   Resp 16   Ht 5\' 8"  (1.727 m)   Wt 79.4 kg   SpO2 100%   BMI 26.61 kg/m   Physical Exam Vitals signs and nursing note  reviewed.  Constitutional:      General: He is not in acute distress.    Appearance: He is well-developed.  HENT:     Head: Normocephalic and atraumatic.  Eyes:     Conjunctiva/sclera: Conjunctivae normal.     Pupils: Pupils are equal, round, and reactive to light.  Neck:     Musculoskeletal: Normal range of motion and neck supple.  Cardiovascular:     Rate and Rhythm: Normal rate and regular rhythm.     Heart sounds: Normal heart sounds.  Pulmonary:     Effort: Pulmonary effort is normal. No respiratory distress.     Breath sounds: Normal breath sounds.  Abdominal:     General: There is no distension.     Palpations: Abdomen is soft.     Tenderness: There is no abdominal tenderness.  Musculoskeletal: Normal range of motion.        General: No deformity.  Skin:    General: Skin is warm and dry.  Neurological:     Mental Status: He is alert and oriented to person, place, and time.      ED Treatments / Results  Labs (all labs ordered are listed, but only abnormal results are displayed) Labs Reviewed  CBG MONITORING, ED    EKG EKG Interpretation  Date/Time:  Tuesday October 28 2018 17:24:18 EST Ventricular Rate:  83 PR Interval:  148 QRS Duration: 88 QT Interval:  364 QTC Calculation: 427 R Axis:   63 Text Interpretation:  Normal sinus rhythm Normal ECG Confirmed by Kristine RoyalMessick, Adrian Specht [phone]) on 10/28/2018 5:30:06 PM   Radiology Dg Chest 2 View  Result Date: 10/28/2018 CLINICAL DATA:  Anterior chest pain radiating bilaterally. EXAM: CHEST - 2 VIEW COMPARISON:  11/05/2015. FINDINGS: Normal sized heart. Clear lungs. Stable mild peribronchial thickening and mild dextroconvex thoracic scoliosis. IMPRESSION: No acute abnormality. Stable mild chronic bronchitic changes. Electronically Signed   By: Beckie SaltsSteven  Reid M.D.   On: 10/28/2018 17:37    Procedures Procedures (including critical care time)  Medications Ordered in ED Medications - No data to display   Initial  Impression / Assessment and Plan / ED Course  I have reviewed the triage vital signs and the nursing notes.  Pertinent labs & imaging results that were available during my care of the patient were reviewed by me and considered in my medical decision making (see chart for details).     MDM  Screen complete   Patient is presenting for evaluation of chest discomfort.  Patient's described symptoms are not consistent with ACS.  EKG is without evidence of ischemia.  Chest x-ray does not show any acute process.  Described symptoms are much more consistent with likely musculoskeletal irritation and for possible radiculopathy.  Patient appears to be appropriate for discharge.  Importance of close follow-up is stressed.  Strict return precautions are given  and understood.  Final Clinical Impressions(s) / ED Diagnoses   Final diagnoses:  Atypical chest pain    ED Discharge Orders    None       Wynetta Fines, MD 10/28/18 1800    Wynetta Fines, MD 10/28/18 [phone]

## 2018-10-28 NOTE — Discharge Instructions (Addendum)
Please return for any problem.  Follow-up with your regular care provider as instructed. °

## 2018-10-28 NOTE — ED Notes (Signed)
Patient Alert and oriented to baseline. Stable and ambulatory to baseline. Patient verbalized understanding of the discharge instructions.  Patient belongings were taken by the patient.   

## 2018-10-28 NOTE — ED Notes (Signed)
CBG was 194.

## 2018-12-08 ENCOUNTER — Emergency Department (HOSPITAL_COMMUNITY)
Admission: EM | Admit: 2018-12-08 | Discharge: 2018-12-08 | Disposition: A | Discharge status: Home / Self Care | Payer: Self-pay | Attending: Emergency Medicine | Admitting: Emergency Medicine

## 2018-12-08 ENCOUNTER — Encounter (HOSPITAL_COMMUNITY): Payer: Self-pay | Admitting: Family Medicine

## 2018-12-08 DIAGNOSIS — E119 Type 2 diabetes mellitus without complications: Secondary | ICD-10-CM | POA: Insufficient documentation

## 2018-12-08 DIAGNOSIS — K029 Dental caries, unspecified: Secondary | ICD-10-CM | POA: Insufficient documentation

## 2018-12-08 DIAGNOSIS — Z794 Long term (current) use of insulin: Secondary | ICD-10-CM | POA: Insufficient documentation

## 2018-12-08 DIAGNOSIS — Z79899 Other long term (current) drug therapy: Secondary | ICD-10-CM | POA: Insufficient documentation

## 2018-12-08 DIAGNOSIS — K0889 Other specified disorders of teeth and supporting structures: Secondary | ICD-10-CM

## 2018-12-08 MED ORDER — AMOXICILLIN-POT CLAVULANATE 875-125 MG PO TABS: 1.0000 | ORAL_TABLET | Freq: Two times a day (BID) | ORAL | 0 refills | Status: AC

## 2018-12-08 NOTE — ED Provider Notes (Signed)
[facility]-EMERGENCY DEPT Provider Note   CSN: [id number] Arrival date & time: 12/08/18  1607    History   Chief Complaint Chief Complaint  Patient presents with  . Dental Pain    HPI Alan Burke is a 28 y.o. male.     HPI   Patient is a 28 year old male who presents emergency department today for evaluation of right lower dental pain that has been chronic for several months but seem to worsen about a week ago after using Orajel.  He denies any known fevers.  States that he has pain with swallowing.  Pain is constant and severe in nature.  It is not resolved with over-the-counter Excedrin, Tylenol or Motrin.  He has tried following up with a dentist who told him that he needed surgery.  Past Medical History:  Diagnosis Date  . Diabetes mellitus without complication (HCC)     There are no active problems to display for this patient.   History reviewed. No pertinent surgical history.      Home Medications    Prior to Admission medications   Medication Sig Start Date End Date Taking? Authorizing Provider  amoxicillin (AMOXIL) 500 MG capsule Take 1 capsule (500 mg total) by mouth 3 (three) times daily. 10/03/17   Arthor Captain, PA-C  amoxicillin-clavulanate (AUGMENTIN) 875-125 MG tablet Take 1 tablet by mouth every 12 (twelve) hours for 7 days. 12/08/18 12/15/18  Yeudiel Mateo S, PA-C  aspirin-acetaminophen-caffeine (EXCEDRIN MIGRAINE) [phone] MG tablet Take 2 tablets by mouth every 6 (six) hours as needed for headache.    [provider]  clindamycin (CLEOCIN) 150 MG capsule Take 2 capsules (300 mg total) by mouth every 6 (six) hours. 10/05/18   Kirichenko, Lemont Fillers, PA-C  HYDROcodone-acetaminophen (NORCO) 5-325 MG tablet Take 1-2 tablets by mouth every 4 (four) hours as needed. Patient not taking: Reported on 10/03/2017 08/25/16   Arthor Captain, PA-C  ibuprofen (ADVIL,MOTRIN) 800 MG tablet Take 1 tablet (800 mg total) by mouth every 6  (six) hours as needed. 10/05/18   Kirichenko, Tatyana, PA-C  insulin NPH Human (HUMULIN N,NOVOLIN N) 100 UNIT/ML injection Inject 40 Units into the skin 2 (two) times daily before a meal.    [provider]  lidocaine (XYLOCAINE) 2 % solution Use as directed 15 mLs in the mouth or throat as needed for mouth pain. Or can apply topically with q-tip/cotton ball. 09/16/18   Garlon Hatchet, PA-C  meloxicam (MOBIC) 15 MG tablet Take 1 tablet (15 mg total) by mouth daily. 10/03/17   Arthor Captain, PA-C  naproxen (NAPROSYN) 500 MG tablet Take 1 tablet (500 mg total) by mouth 2 (two) times daily with a meal. 09/16/18   Garlon Hatchet, PA-C  penicillin v potassium (VEETID) 500 MG tablet Take 1 tablet (500 mg total) by mouth 4 (four) times daily. 09/16/18   Garlon Hatchet, PA-C    Family History History reviewed. No pertinent family history.  Social History Social History   Tobacco Use  . Smoking status: Never Smoker  . Smokeless tobacco: Never Used  Substance Use Topics  . Alcohol use: No  . Drug use: No     Allergies   Patient has no known allergies.   Review of Systems Review of Systems  Constitutional: Negative for fever.  HENT: Positive for dental problem.      Physical Exam Updated Vital Signs BP 126/73 (BP Location: Left Arm)   Pulse 83   Temp 98.1 F (36.7 C) (Oral)  Resp 16   Ht 5\' 8"  (1.727 m)   Wt 79.4 kg   SpO2 100%   BMI 26.61 kg/m   Physical Exam Constitutional:      General: He is not in acute distress.    Appearance: He is well-developed.  HENT:     Mouth/Throat:     Mouth: Mucous membranes are moist.     Pharynx: No oropharyngeal exudate or posterior oropharyngeal erythema.     Comments: Multiple dental caries.  Tooth #28 is fractured and tender to precaution.  Tooth #32 is also tender to percussion.  Tooth #16 tender to percussion.  No obvious dental abscesses or evidence of deep space infection.  Tolerating secretions.  No facial  swelling. Eyes:     Conjunctiva/sclera: Conjunctivae normal.  Cardiovascular:     Rate and Rhythm: Normal rate and regular rhythm.  Pulmonary:     Effort: Pulmonary effort is normal.     Breath sounds: Normal breath sounds.  Skin:    General: Skin is warm and dry.  Neurological:     Mental Status: He is alert and oriented to person, place, and time.      ED Treatments / Results  Labs (all labs ordered are listed, but only abnormal results are displayed) Labs Reviewed - No data to display  EKG None  Radiology No results found.  Procedures Procedures (including critical care time)  Medications Ordered in ED Medications - No data to display   Initial Impression / Assessment and Plan / ED Course  I have reviewed the triage vital signs and the nursing notes.  Pertinent labs & imaging results that were available during my care of the patient were reviewed by me and considered in my medical decision making (see chart for details).      Final Clinical Impressions(s) / ED Diagnoses   Final diagnoses:  Pain, dental  Dental caries   Patient with toothache.  No gross abscess.  Exam unconcerning for Ludwig's angina or spread of infection.  Will treat with penicillin and pain medicine.  Urged patient to follow-up with dentist.     ED Discharge Orders         Ordered    amoxicillin-clavulanate (AUGMENTIN) 875-125 MG tablet  Every 12 hours     12/08/18 1632           Karrie Meres, PA-C 12/08/18 1632    Terrilee Files, MD 12/09/18 1047

## 2018-12-08 NOTE — Discharge Instructions (Addendum)
You were given a prescription for antibiotics. Please take the antibiotic prescription fully.   Please follow-up with a dentist in the next 5 to 7 days for reevaluation.  If you do not have a dentist, resources were provided for dentist in the area in your discharge summary.  Please contact one of the offices that are listed and make an appointment for follow-up.  Please return to the emergency department for any new or worsening symptoms.  

## 2018-12-08 NOTE — ED Triage Notes (Signed)
Patient has dental pain in upper and lower gum line and teeth. Patient reports he needs an antibiotic. He reports he is suppose to have dental insurance on March 1st and plans to [employer] in [place]. He reports he has been taking OTC medication for pain.

## 2018-12-09 ENCOUNTER — Ambulatory Visit (HOSPITAL_COMMUNITY)
Admission: EM | Admit: 2018-12-09 | Discharge: 2018-12-09 | Disposition: A | Discharge status: Home / Self Care | Payer: Self-pay | Attending: Emergency Medicine | Admitting: Emergency Medicine

## 2018-12-09 ENCOUNTER — Encounter (HOSPITAL_COMMUNITY): Payer: Self-pay | Admitting: Emergency Medicine

## 2018-12-09 DIAGNOSIS — K0889 Other specified disorders of teeth and supporting structures: Secondary | ICD-10-CM

## 2018-12-09 DIAGNOSIS — K029 Dental caries, unspecified: Secondary | ICD-10-CM

## 2018-12-09 MED ORDER — TRAMADOL HCL 50 MG PO TABS: 50.0000 mg | ORAL_TABLET | Freq: Four times a day (QID) | ORAL | 0 refills | Status: DC | PRN

## 2018-12-09 MED ORDER — MAGIC MOUTHWASH W/LIDOCAINE: 10.0000 mL | Freq: Three times a day (TID) | ORAL | 1 refills | Status: DC

## 2018-12-09 NOTE — ED Triage Notes (Addendum)
Pt c/o tooth pain x2 weeks, states insurance doesn't start until next month. Pt went to ER yesterday, did not get pain medicine, given augmentin, hasn't started it.

## 2018-12-09 NOTE — ED Provider Notes (Signed)
[facility]    CSN: [id number] Arrival date & time: 12/09/18  1911     History   Chief Complaint Chief Complaint  Patient presents with  . Dental Pain    HPI Alan Burke is a 28 y.o. male.   Pt was seen in er last night and was not able to obtain his rx for abx for tooth abscess. He states that he is in pain has been taking 5 Excedrin a day. Will not have dental insurance until next month to see a dentis. Has been [facility] in [facility] past .      Past Medical History:  Diagnosis Date  . Diabetes mellitus without complication (HCC)     There are no active problems to display for this patient.   History reviewed. No pertinent surgical history.     Home Medications    Prior to Admission medications   Medication Sig Start Date End Date Taking? Authorizing Provider  amoxicillin (AMOXIL) 500 MG capsule Take 1 capsule (500 mg total) by mouth 3 (three) times daily. Patient not taking: Reported on 12/09/2018 10/03/17   Arthor Captain, PA-C  amoxicillin-clavulanate (AUGMENTIN) 875-125 MG tablet Take 1 tablet by mouth every 12 (twelve) hours for 7 days. Patient not taking: Reported on 12/09/2018 12/08/18 12/15/18  Couture, Cortni S, PA-C  aspirin-acetaminophen-caffeine (EXCEDRIN MIGRAINE) [phone] MG tablet Take 2 tablets by mouth every 6 (six) hours as needed for headache.    [provider]  clindamycin (CLEOCIN) 150 MG capsule Take 2 capsules (300 mg total) by mouth every 6 (six) hours. Patient not taking: Reported on 12/09/2018 10/05/18   Jaynie Crumble, PA-C  HYDROcodone-acetaminophen (NORCO) 5-325 MG tablet Take 1-2 tablets by mouth every 4 (four) hours as needed. Patient not taking: Reported on 10/03/2017 08/25/16   Arthor Captain, PA-C  ibuprofen (ADVIL,MOTRIN) 800 MG tablet Take 1 tablet (800 mg total) by mouth every 6 (six) hours as needed. 10/05/18   Kirichenko, Tatyana, PA-C  insulin NPH Human (HUMULIN N,NOVOLIN N) 100 UNIT/ML injection Inject  40 Units into the skin 2 (two) times daily before a meal.    [provider]  lidocaine (XYLOCAINE) 2 % solution Use as directed 15 mLs in the mouth or throat as needed for mouth pain. Or can apply topically with q-tip/cotton ball. 09/16/18   Garlon Hatchet, PA-C  meloxicam (MOBIC) 15 MG tablet Take 1 tablet (15 mg total) by mouth daily. Patient not taking: Reported on 12/09/2018 10/03/17   Arthor Captain, PA-C  naproxen (NAPROSYN) 500 MG tablet Take 1 tablet (500 mg total) by mouth 2 (two) times daily with a meal. Patient not taking: Reported on 12/09/2018 09/16/18   Garlon Hatchet, PA-C  penicillin v potassium (VEETID) 500 MG tablet Take 1 tablet (500 mg total) by mouth 4 (four) times daily. Patient not taking: Reported on 12/09/2018 09/16/18   Garlon Hatchet, PA-C    Family History No family history on file.  Social History Social History   Tobacco Use  . Smoking status: Never Smoker  . Smokeless tobacco: Never Used  Substance Use Topics  . Alcohol use: No  . Drug use: No     Allergies   Patient has no known allergies.   Review of Systems Review of Systems  HENT: Positive for dental problem and mouth sores.   Eyes: Negative.   Respiratory: Negative.   Cardiovascular: Negative.   Gastrointestinal: Negative.   Neurological: Negative.      Physical Exam Triage Vital Signs  ED Triage Vitals  Enc Vitals Group     BP 12/09/18 1922 140/73     Pulse Rate 12/09/18 1922 92     Resp 12/09/18 1922 18     Temp 12/09/18 1922 98 F (36.7 C)     Temp src --      SpO2 12/09/18 1922 98 %     Weight --      Height --      Head Circumference --      Peak Flow --      Pain Score 12/09/18 1921 9     Pain Loc --      Pain Edu? --      Excl. in GC? --    No data found.  Updated Vital Signs BP 140/73   Pulse 92   Temp 98 F (36.7 C)   Resp 18   SpO2 98%   Visual Acuity     Physical Exam HENT:     Nose: Nose normal.     Mouth/Throat:     Pharynx: Posterior  oropharyngeal erythema present.     Comments: RT upper wisdom tooth has erythema and edema , 4th tooth bottom rt is broke.  Neck:     Musculoskeletal: Normal range of motion.  Cardiovascular:     Rate and Rhythm: Normal rate.  Pulmonary:     Effort: Pulmonary effort is normal.  Abdominal:     General: Abdomen is flat.  Skin:    General: Skin is warm.  Neurological:     General: No focal deficit present.     Mental Status: He is alert.      UC Treatments / Results  Labs (all labs ordered are listed, but only abnormal results are displayed) Labs Reviewed - No data to display  EKG None  Radiology No results found.  Procedures Procedures (including critical care time)  Medications Ordered in UC Medications - No data to display  Initial Impression / Assessment and Plan / UC Course  I have reviewed the triage vital signs and the nursing notes.  Pertinent labs & imaging results that were available during my care of the patient were reviewed by me and considered in my medical decision making (see chart for details).     Will need to obtain the abx rx from last night  Stop taking Excedrin take tylenol or motrin and pain prn  Will need to see dental soon    Final Clinical Impressions(s) / UC Diagnoses   Final diagnoses:  None   Discharge Instructions   None    ED Prescriptions    None     Controlled Substance Prescriptions [place] Controlled Substance Registry consulted? Yes, I have consulted the [place] Controlled Substances Registry for this patient, and feel the risk/benefit ratio today is favorable for proceeding with this prescription for a controlled substance.   Coralyn Mark, NP 12/09/18 [phone]

## 2019-08-17 ENCOUNTER — Other Ambulatory Visit: Payer: Self-pay

## 2019-08-17 DIAGNOSIS — Z20822 Contact with and (suspected) exposure to covid-19: Secondary | ICD-10-CM

## 2019-08-18 LAB — NOVEL CORONAVIRUS, NAA: SARS-CoV-2, NAA: NOT DETECTED

## 2019-10-06 ENCOUNTER — Other Ambulatory Visit: Payer: Self-pay

## 2019-11-20 ENCOUNTER — Ambulatory Visit: Payer: No Typology Code available for payment source | Attending: Internal Medicine

## 2019-11-20 DIAGNOSIS — U071 COVID-19: Secondary | ICD-10-CM | POA: Insufficient documentation

## 2019-11-20 DIAGNOSIS — Z20822 Contact with and (suspected) exposure to covid-19: Secondary | ICD-10-CM

## 2019-11-21 LAB — NOVEL CORONAVIRUS, NAA: SARS-CoV-2, NAA: DETECTED — AB

## 2020-01-21 ENCOUNTER — Encounter (HOSPITAL_COMMUNITY): Payer: Self-pay | Admitting: Emergency Medicine

## 2020-01-21 ENCOUNTER — Emergency Department (HOSPITAL_COMMUNITY): Payer: Self-pay

## 2020-01-21 ENCOUNTER — Emergency Department (HOSPITAL_COMMUNITY)
Admission: EM | Admit: 2020-01-21 | Discharge: 2020-01-21 | Disposition: A | Discharge status: Home / Self Care | Payer: Self-pay | Attending: Emergency Medicine | Admitting: Emergency Medicine

## 2020-01-21 DIAGNOSIS — E1165 Type 2 diabetes mellitus with hyperglycemia: Secondary | ICD-10-CM | POA: Insufficient documentation

## 2020-01-21 DIAGNOSIS — Z794 Long term (current) use of insulin: Secondary | ICD-10-CM | POA: Insufficient documentation

## 2020-01-21 DIAGNOSIS — R739 Hyperglycemia, unspecified: Secondary | ICD-10-CM

## 2020-01-21 DIAGNOSIS — Z79899 Other long term (current) drug therapy: Secondary | ICD-10-CM | POA: Insufficient documentation

## 2020-01-21 LAB — BASIC METABOLIC PANEL
Anion gap: 6 (ref 5–15)
BUN: 16 mg/dL (ref 6–20)
CO2: 28 mmol/L (ref 22–32)
Calcium: 9.2 mg/dL (ref 8.9–10.3)
Chloride: 103 mmol/L (ref 98–111)
Creatinine, Ser: 1.12 mg/dL (ref 0.61–1.24)
GFR calc Af Amer: >60 mL/min (ref >60)
GFR calc non Af Amer: >60 mL/min (ref >60)
Glucose, Bld: 257 mg/dL — ABNORMAL HIGH (ref 70–99)
Potassium: 4.5 mmol/L (ref 3.5–5.1)
Sodium: 137 mmol/L (ref 135–145)

## 2020-01-21 LAB — CBC
HCT: 42.5 % (ref 39.0–52.0)
Hemoglobin: 13.6 g/dL (ref 13.0–17.0)
MCH: 28.2 pg (ref 26.0–34.0)
MCHC: 32 g/dL (ref 30.0–36.0)
MCV: 88.2 fL (ref 80.0–100.0)
Platelets: 183 10*3/uL (ref 150–400)
RBC: 4.82 MIL/uL (ref 4.22–5.81)
RDW: 13.5 % (ref 11.5–15.5)
WBC: 8.7 10*3/uL (ref 4.0–10.5)
nRBC: 0 % (ref 0.0–0.2)

## 2020-01-21 LAB — CBG MONITORING, ED
Glucose-Capillary: 175 mg/dL — ABNORMAL HIGH (ref 70–99)
Glucose-Capillary: 114 mg/dL — ABNORMAL HIGH (ref 70–99)

## 2020-01-21 LAB — ETHANOL: Alcohol, Ethyl (B): <10 mg/dL (ref <10)

## 2020-01-21 MED ORDER — SODIUM CHLORIDE 0.9 % IV BOLUS
1000.0000 mL | Freq: Once | INTRAVENOUS | Status: AC
  Administered 2020-01-21: 19:00:00 1000 mL via INTRAVENOUS

## 2020-01-21 MED ORDER — ONDANSETRON 4 MG PO TBDP: 4.0000 mg | ORAL_TABLET | Freq: Three times a day (TID) | ORAL | 0 refills | Status: DC | PRN

## 2020-01-21 MED ORDER — ONDANSETRON HCL 4 MG/2ML IJ SOLN
4.0000 mg | Freq: Once | INTRAMUSCULAR | Status: AC
  Administered 2020-01-21: 4 mg via INTRAVENOUS
  Filled 2020-01-21: qty 2

## 2020-01-21 NOTE — Discharge Instructions (Signed)
If you develop fever, vomiting, severe headache, abdominal pain, chest pain, or any other new/concerning symptoms then return to the ER for evaluation.  Be sure to continue to check your glucose before you go to bed tonight to make sure it is adequate prior to giving yourself medicines or going to sleep.  Otherwise be sure to eat and drink as normal.

## 2020-01-21 NOTE — ED Notes (Signed)
Pt has urinal at bedside and is aware urine sample is needed

## 2020-01-21 NOTE — ED Notes (Signed)
Meghan RN to bedside to attempt IV

## 2020-01-21 NOTE — ED Provider Notes (Signed)
[facility] DEPT Provider Note   CSN: [id number] Arrival date & time: 01/21/20  1400     History Chief Complaint  Patient presents with  . Hyperglycemia    Alan Burke is a 29 y.o. male.  HPI 29 year old male presents with dizziness.  He states he was getting a tattoo this afternoon and was given some alcohol to drink.  He is not sure exactly what was in it but was told it had moonshine and margarita.  Sometime later he started feeling dizzy.  He was shaky and was wondering if he was having low blood sugar.  Went to his car and took 2 glucose tablets.  Later checked his glucose and it was over 300 so he took 15 units of NovoLog.  Sometime after that he went to the bathroom and was having vomiting, diarrhea and dizziness.  He also has noticed some shortness of breath.  No chest pain or headache.  No fevers.   Past Medical History:  Diagnosis Date  . Diabetes mellitus without complication ([place])     There are no problems to display for this patient.   History reviewed. No pertinent surgical history.     No family history on file.  Social History   Tobacco Use  . Smoking status: Never Smoker  . Smokeless tobacco: Never Used  Substance Use Topics  . Alcohol use: Yes  . Drug use: No    Home Medications Prior to Admission medications   Medication Sig Start Date End Date Taking? Authorizing Provider  ibuprofen (ADVIL,MOTRIN) 800 MG tablet Take 1 tablet (800 mg total) by mouth every 6 (six) hours as needed. 10/05/18  Yes Kirichenko, Tatyana, PA-C  insulin NPH-regular Human (70-30) 100 UNIT/ML injection Inject 40 Units into the skin 2 (two) times daily with a meal.   Yes [provider]  amoxicillin (AMOXIL) 500 MG capsule Take 1 capsule (500 mg total) by mouth 3 (three) times daily. Patient not taking: Reported on 12/09/2018 10/03/17   Margarita Mail, PA-C  clindamycin (CLEOCIN) 150 MG capsule Take 2 capsules (300 mg total) by mouth  every 6 (six) hours. Patient not taking: Reported on 12/09/2018 10/05/18   Jeannett Senior, PA-C  HYDROcodone-acetaminophen (NORCO) 5-325 MG tablet Take 1-2 tablets by mouth every 4 (four) hours as needed. Patient not taking: Reported on 10/03/2017 08/25/16   Margarita Mail, PA-C  lidocaine (XYLOCAINE) 2 % solution Use as directed 15 mLs in the mouth or throat as needed for mouth pain. Or can apply topically with q-tip/cotton ball. Patient not taking: Reported on 01/21/2020 09/16/18   Larene Pickett, PA-C  magic mouthwash w/lidocaine SOLN Take 10 mLs by mouth 3 (three) times daily. Patient not taking: Reported on 01/21/2020 12/09/18   Marney Setting, NP  meloxicam (MOBIC) 15 MG tablet Take 1 tablet (15 mg total) by mouth daily. Patient not taking: Reported on 12/09/2018 10/03/17   Margarita Mail, PA-C  naproxen (NAPROSYN) 500 MG tablet Take 1 tablet (500 mg total) by mouth 2 (two) times daily with a meal. Patient not taking: Reported on 12/09/2018 09/16/18   Larene Pickett, PA-C  ondansetron (ZOFRAN ODT) 4 MG disintegrating tablet Take 1 tablet (4 mg total) by mouth every 8 (eight) hours as needed for nausea or vomiting. 01/21/20   Sherwood Gambler, MD  penicillin v potassium (VEETID) 500 MG tablet Take 1 tablet (500 mg total) by mouth 4 (four) times daily. Patient not taking: Reported on 12/09/2018 09/16/18   Larene Pickett,  PA-C  traMADol (ULTRAM) 50 MG tablet Take 1 tablet (50 mg total) by mouth every 6 (six) hours as needed. Patient not taking: Reported on 01/21/2020 12/09/18   Coralyn Mark, NP    Allergies    Patient has no known allergies.  Review of Systems   Review of Systems  Constitutional: Positive for fatigue. Negative for fever.  Respiratory: Positive for shortness of breath.   Cardiovascular: Negative for chest pain.  Gastrointestinal: Positive for diarrhea and vomiting.  Neurological: Positive for dizziness. Negative for headaches.  All other systems reviewed and are  negative.   Physical Exam Updated Vital Signs BP 112/67   Pulse 73   Temp 97.8 F (36.6 C) (Oral)   Resp 12   Ht 5\' 9"  (1.753 m)   Wt 77.1 kg   SpO2 99%   BMI 25.10 kg/m   Physical Exam Vitals and nursing note reviewed.  Constitutional:      General: He is not in acute distress.    Appearance: He is well-developed. He is not ill-appearing or diaphoretic.  HENT:     Head: Normocephalic and atraumatic.     Right Ear: External ear normal.     Left Ear: External ear normal.     Nose: Nose normal.  Eyes:     General:        Right eye: No discharge.        Left eye: No discharge.     Extraocular Movements: Extraocular movements intact.     Pupils: Pupils are equal, round, and reactive to light.  Cardiovascular:     Rate and Rhythm: Normal rate and regular rhythm.     Heart sounds: Normal heart sounds.  Pulmonary:     Effort: Pulmonary effort is normal.     Breath sounds: Normal breath sounds.  Abdominal:     Palpations: Abdomen is soft.     Tenderness: There is no abdominal tenderness.  Musculoskeletal:     Cervical back: Neck supple.  Skin:    General: Skin is warm and dry.  Neurological:     Mental Status: He is alert.     Comments: CN 3-12 grossly intact. 5/5 strength in all 4 extremities. Grossly normal sensation. Normal finger to nose.   Psychiatric:        Mood and Affect: Mood is not anxious.     ED Results / Procedures / Treatments   Labs (all labs ordered are listed, but only abnormal results are displayed) Labs Reviewed  BASIC METABOLIC PANEL - Abnormal; Notable for the following components:      Result Value   Glucose, Bld 257 (*)    All other components within normal limits  CBG MONITORING, ED - Abnormal; Notable for the following components:   Glucose-Capillary 175 (*)    All other components within normal limits  CBG MONITORING, ED - Abnormal; Notable for the following components:   Glucose-Capillary 114 (*)    All other components within  normal limits  CBC  ETHANOL  URINALYSIS, ROUTINE W REFLEX MICROSCOPIC  CBG MONITORING, ED    EKG EKG Interpretation  Date/Time:  Thursday January 21 2020 18:27:02 EDT Ventricular Rate:  74 PR Interval:    QRS Duration: 98 QT Interval:  381 QTC Calculation: 423 R Axis:   50 Text Interpretation: Sinus rhythm RSR' in V1 or V2, probably normal variant Probable left ventricular hypertrophy no significant change since Jan 2020 Confirmed by Feb 2020 [phone]) on 01/21/2020 6:51:41 PM   Radiology  DG Chest Portable 1 View  Result Date: 01/21/2020 CLINICAL DATA:  29 year old male with shortness of breath. EXAM: PORTABLE CHEST 1 VIEW COMPARISON:  Chest radiograph dated 10/28/2018. FINDINGS: The heart size and mediastinal contours are within normal limits. Both lungs are clear. The visualized skeletal structures are unremarkable. IMPRESSION: No active disease. Electronically Signed   By: Elgie Collard M.D.   On: 01/21/2020 18:46    Procedures Procedures (including critical care time)  Medications Ordered in ED Medications  sodium chloride 0.9 % bolus 1,000 mL (1,000 mLs Intravenous New Bag/Given (Non-Interop) 01/21/20 1855)  ondansetron (ZOFRAN) injection 4 mg (4 mg Intravenous Given 01/21/20 1855)    ED Course  I have reviewed the triage vital signs and the nursing notes.  Pertinent labs & imaging results that were available during my care of the patient were reviewed by me and considered in my medical decision making (see chart for details).    MDM Rules/Calculators/A&P                      Patient is feeling better with fluids.  His glucose has been downtrending.  It has now been over 5 hours since he gave himself the NovoLog so think it is reasonable to discharge home and he can continue to check his glucose at home.  His mom is at the bedside and will help take him home.  Otherwise no vomiting.  Exam is pretty unremarkable.  I think the shortness of breath is likely reactive to  the vomiting, diarrhea, dehydration rather than an acute chest process such as PE or ACS.  Will discharge home with nausea medicine. Final Clinical Impression(s) / ED Diagnoses Final diagnoses:  Hyperglycemia    Rx / DC Orders ED Discharge Orders         Ordered    ondansetron (ZOFRAN ODT) 4 MG disintegrating tablet  Every 8 hours PRN     01/21/20 2001           Pricilla Loveless, MD 01/21/20 2005

## 2020-01-21 NOTE — ED Triage Notes (Signed)
Per EMS-states he was getting tattoo-started drinking moonshine and margarita mix-during tattoo he felt his sugar getting low so he took glutose tab-started vomiting so he thought he sugar was high so he gave himself an unknown amount of insulin-business called [id number]

## 2020-01-21 NOTE — ED Notes (Signed)
Attempted to obtain IV access. Pt stating "it hurts, it hurts, take it out, take it out" Needle withdrawn. Will have [place] IV RN attempt to gain access.

## 2020-04-08 ENCOUNTER — Emergency Department (HOSPITAL_COMMUNITY)
Admission: EM | Admit: 2020-04-08 | Discharge: 2020-04-09 | Disposition: A | Discharge status: Home / Self Care | Payer: Self-pay | Attending: Emergency Medicine | Admitting: Emergency Medicine

## 2020-04-08 ENCOUNTER — Encounter (HOSPITAL_COMMUNITY): Payer: Self-pay

## 2020-04-08 ENCOUNTER — Other Ambulatory Visit: Payer: Self-pay

## 2020-04-08 ENCOUNTER — Emergency Department (HOSPITAL_COMMUNITY): Payer: Self-pay

## 2020-04-08 DIAGNOSIS — R079 Chest pain, unspecified: Secondary | ICD-10-CM | POA: Insufficient documentation

## 2020-04-08 LAB — BASIC METABOLIC PANEL
Anion gap: 12 (ref 5–15)
BUN: 14 mg/dL (ref 6–20)
CO2: 25 mmol/L (ref 22–32)
Calcium: 9.2 mg/dL (ref 8.9–10.3)
Chloride: 100 mmol/L (ref 98–111)
Creatinine, Ser: 0.92 mg/dL (ref 0.61–1.24)
GFR calc Af Amer: >60 mL/min (ref >60)
GFR calc non Af Amer: >60 mL/min (ref >60)
Glucose, Bld: 261 mg/dL — ABNORMAL HIGH (ref 70–99)
Potassium: 4 mmol/L (ref 3.5–5.1)
Sodium: 137 mmol/L (ref 135–145)

## 2020-04-08 LAB — CBC
HCT: 41.7 % (ref 39.0–52.0)
Hemoglobin: 13.6 g/dL (ref 13.0–17.0)
MCH: 28.6 pg (ref 26.0–34.0)
MCHC: 32.6 g/dL (ref 30.0–36.0)
MCV: 87.6 fL (ref 80.0–100.0)
Platelets: 184 10*3/uL (ref 150–400)
RBC: 4.76 MIL/uL (ref 4.22–5.81)
RDW: 13.1 % (ref 11.5–15.5)
WBC: 7.9 10*3/uL (ref 4.0–10.5)
nRBC: 0 % (ref 0.0–0.2)

## 2020-04-08 LAB — TROPONIN I (HIGH SENSITIVITY): Troponin I (High Sensitivity): 5 ng/L (ref <18)

## 2020-04-08 LAB — GLUCOSE, CAPILLARY: Glucose-Capillary: 228 mg/dL — ABNORMAL HIGH (ref 70–99)

## 2020-04-08 NOTE — ED Triage Notes (Signed)
Pt sts left sided chest pain for approx 2 months. Pt sts he is an unstable type 1 diabetic. Several stressors recently.

## 2020-04-09 NOTE — ED Notes (Signed)
Pt eloped from treatment area. Called 3x for room placement with no answer.

## 2020-06-20 ENCOUNTER — Other Ambulatory Visit: Payer: Self-pay

## 2020-06-20 ENCOUNTER — Emergency Department (HOSPITAL_COMMUNITY)
Admission: EM | Admit: 2020-06-20 | Discharge: 2020-06-21 | Disposition: A | Discharge status: Home / Self Care | Payer: Self-pay | Attending: Emergency Medicine | Admitting: Emergency Medicine

## 2020-06-20 ENCOUNTER — Emergency Department (HOSPITAL_COMMUNITY): Payer: Self-pay

## 2020-06-20 DIAGNOSIS — R0789 Other chest pain: Secondary | ICD-10-CM | POA: Insufficient documentation

## 2020-06-20 DIAGNOSIS — R079 Chest pain, unspecified: Secondary | ICD-10-CM

## 2020-06-20 DIAGNOSIS — Z20822 Contact with and (suspected) exposure to covid-19: Secondary | ICD-10-CM | POA: Insufficient documentation

## 2020-06-20 DIAGNOSIS — Z794 Long term (current) use of insulin: Secondary | ICD-10-CM | POA: Insufficient documentation

## 2020-06-20 DIAGNOSIS — E119 Type 2 diabetes mellitus without complications: Secondary | ICD-10-CM | POA: Insufficient documentation

## 2020-06-20 LAB — CBC
HCT: 44.5 % (ref 39.0–52.0)
Hemoglobin: 14.1 g/dL (ref 13.0–17.0)
MCH: 27.8 pg (ref 26.0–34.0)
MCHC: 31.7 g/dL (ref 30.0–36.0)
MCV: 87.6 fL (ref 80.0–100.0)
Platelets: 196 10*3/uL (ref 150–400)
RBC: 5.08 MIL/uL (ref 4.22–5.81)
RDW: 13.1 % (ref 11.5–15.5)
WBC: 8.9 10*3/uL (ref 4.0–10.5)
nRBC: 0 % (ref 0.0–0.2)

## 2020-06-20 LAB — BASIC METABOLIC PANEL
Anion gap: 11 (ref 5–15)
BUN: 14 mg/dL (ref 6–20)
CO2: 23 mmol/L (ref 22–32)
Calcium: 9.7 mg/dL (ref 8.9–10.3)
Chloride: 102 mmol/L (ref 98–111)
Creatinine, Ser: 1.03 mg/dL (ref 0.61–1.24)
GFR calc Af Amer: >60 mL/min (ref >60)
GFR calc non Af Amer: >60 mL/min (ref >60)
Glucose, Bld: 249 mg/dL — ABNORMAL HIGH (ref 70–99)
Potassium: 4.1 mmol/L (ref 3.5–5.1)
Sodium: 136 mmol/L (ref 135–145)

## 2020-06-20 LAB — HEPATIC FUNCTION PANEL
ALT: 22 U/L (ref 0–44)
AST: 28 U/L (ref 15–41)
Albumin: 4 g/dL (ref 3.5–5.0)
Alkaline Phosphatase: 69 U/L (ref 38–126)
Bilirubin, Direct: <0.1 mg/dL (ref 0.0–0.2)
Total Bilirubin: 0.8 mg/dL (ref 0.3–1.2)
Total Protein: 7.5 g/dL (ref 6.5–8.1)

## 2020-06-20 LAB — TROPONIN I (HIGH SENSITIVITY)
Troponin I (High Sensitivity): 28 ng/L — ABNORMAL HIGH (ref <18)
Troponin I (High Sensitivity): 25 ng/L — ABNORMAL HIGH (ref <18)

## 2020-06-20 LAB — SARS CORONAVIRUS 2 BY RT PCR (HOSPITAL ORDER, PERFORMED IN ~~LOC~~ HOSPITAL LAB): SARS Coronavirus 2: NEGATIVE

## 2020-06-20 LAB — D-DIMER, QUANTITATIVE: D-Dimer, Quant: 1.69 ug/mL-FEU — ABNORMAL HIGH (ref 0.00–0.50)

## 2020-06-20 LAB — LIPASE, BLOOD: Lipase: 29 U/L (ref 11–51)

## 2020-06-20 MED ORDER — LIDOCAINE 5 % EX PTCH
1.0000 | MEDICATED_PATCH | CUTANEOUS | Status: DC
  Administered 2020-06-20: 1 via TRANSDERMAL
  Filled 2020-06-20: qty 1

## 2020-06-20 MED ORDER — IOHEXOL 350 MG/ML SOLN
100.0000 mL | Freq: Once | INTRAVENOUS | Status: AC | PRN
  Administered 2020-06-20: 100 mL via INTRAVENOUS

## 2020-06-20 MED ORDER — MORPHINE SULFATE (PF) 4 MG/ML IV SOLN
4.0000 mg | Freq: Once | INTRAVENOUS | Status: AC
  Administered 2020-06-20: 4 mg via INTRAVENOUS
  Filled 2020-06-20: qty 1

## 2020-06-20 NOTE — ED Triage Notes (Signed)
Pt reports R sided intermittent chest pain x 2 weeks. Denies pain when active but is worse at rest.

## 2020-06-20 NOTE — ED Notes (Signed)
Patient transported to CT scan . 

## 2020-06-20 NOTE — ED Provider Notes (Signed)
[facility]   CSN: [id number] Arrival date & time: 06/20/20  1427     History Chief Complaint  Patient presents with  . Chest Pain    Alan Burke is a 29 y.o. male with past medical history of diabetes on insulin presents to the ER for evaluation of chest pain.  This is located on the right lateral rib area radiates slightly to right flank/back.  Onset 2 weeks ago.  The pain is mild and constant but significantly worse when he is laying flat on his back, lays on his right side, presses on it.  Symptoms hurts with deep breathing but not always.  Patient states he is very active and runs.  He has stopped doing exercise for the last 2 weeks thinking that it was related to something muscular.  Has also used over-the-counter lidocaine patches which did not help.  States the pain is severe and "debilitating" when it comes.  Reports he has had left-sided chest pain like a "catch" for months or maybe years.  States that this left-sided chest pain has been addressed before in the ER, states his right-sided rib pain is new and different.  Not vaccinated for COVID.  Denies any recent respiratory or GI illnesses in the last 4 to 6 weeks.  Denies associated fever, congestion, sore throat, cough, shortness of breath.  No associated abdominal pain, nausea, vomiting, diarrhea.  No changes in pain after meals.  No urinary symptoms, blood in his urine, kidney stone history.  States his parents smoke heavily but he does not smoke.  Denies illicit drug use or IV drug use.    HPI     Past Medical History:  Diagnosis Date  . Diabetes mellitus without complication (HCC)     There are no problems to display for this patient.   No past surgical history on file.     No family history on file.  Social History   Tobacco Use  . Smoking status: Never Smoker  . Smokeless tobacco: Never Used  Vaping Use  . Vaping Use: Never used  Substance Use Topics  .  Alcohol use: Yes  . Drug use: No    Home Medications Prior to Admission medications   Medication Sig Start Date End Date Taking? Authorizing Provider  amoxicillin (AMOXIL) 500 MG capsule Take 1 capsule (500 mg total) by mouth 3 (three) times daily. Patient not taking: Reported on 12/09/2018 10/03/17   Arthor CaptainHarris, Abigail, PA-C  clindamycin (CLEOCIN) 150 MG capsule Take 2 capsules (300 mg total) by mouth every 6 (six) hours. Patient not taking: Reported on 12/09/2018 10/05/18   Jaynie CrumbleKirichenko, Tatyana, PA-C  HYDROcodone-acetaminophen (NORCO) 5-325 MG tablet Take 1-2 tablets by mouth every 4 (four) hours as needed. Patient not taking: Reported on 10/03/2017 08/25/16   Arthor CaptainHarris, Abigail, PA-C  ibuprofen (ADVIL,MOTRIN) 800 MG tablet Take 1 tablet (800 mg total) by mouth every 6 (six) hours as needed. 10/05/18   Kirichenko, Tatyana, PA-C  insulin NPH-regular Human (70-30) 100 UNIT/ML injection Inject 40 Units into the skin 2 (two) times daily with a meal.    [provider]  lidocaine (XYLOCAINE) 2 % solution Use as directed 15 mLs in the mouth or throat as needed for mouth pain. Or can apply topically with q-tip/cotton ball. Patient not taking: Reported on 01/21/2020 09/16/18   Garlon HatchetSanders, Lisa M, PA-C  magic mouthwash w/lidocaine SOLN Take 10 mLs by mouth 3 (three) times daily. Patient not taking: Reported on 01/21/2020 12/09/18  Coralyn Mark, NP  meloxicam (MOBIC) 15 MG tablet Take 1 tablet (15 mg total) by mouth daily. Patient not taking: Reported on 12/09/2018 10/03/17   Arthor Captain, PA-C  naproxen (NAPROSYN) 500 MG tablet Take 1 tablet (500 mg total) by mouth 2 (two) times daily with a meal. Patient not taking: Reported on 12/09/2018 09/16/18   Garlon Hatchet, PA-C  ondansetron (ZOFRAN ODT) 4 MG disintegrating tablet Take 1 tablet (4 mg total) by mouth every 8 (eight) hours as needed for nausea or vomiting. 01/21/20   Pricilla Loveless, MD  penicillin v potassium (VEETID) 500 MG tablet Take 1  tablet (500 mg total) by mouth 4 (four) times daily. Patient not taking: Reported on 12/09/2018 09/16/18   Garlon Hatchet, PA-C  traMADol (ULTRAM) 50 MG tablet Take 1 tablet (50 mg total) by mouth every 6 (six) hours as needed. Patient not taking: Reported on 01/21/2020 12/09/18   Coralyn Mark, NP    Allergies    Patient has no known allergies.  Review of Systems   Review of Systems  Cardiovascular: Positive for chest pain.  All other systems reviewed and are negative.   Physical Exam Updated Vital Signs BP 125/76 (BP Location: Right Arm)   Pulse 74   Temp 98.1 F (36.7 C) (Oral)   Resp 16   Ht 5\' 8"  (1.727 m)   Wt 82 kg   SpO2 99%   BMI 27.49 kg/m   Physical Exam Constitutional:      Appearance: He is well-developed.     Comments: NAD. Non toxic.   HENT:     Head: Normocephalic and atraumatic.     Nose: Nose normal.  Eyes:     General: Lids are normal.     Conjunctiva/sclera: Conjunctivae normal.  Neck:     Trachea: Trachea normal.     Comments: Trachea midline.  Cardiovascular:     Rate and Rhythm: Normal rate and regular rhythm.     Pulses:          Radial pulses are 1+ on the right side and 1+ on the left side.       Dorsalis pedis pulses are 1+ on the right side and 1+ on the left side.     Heart sounds: Normal heart sounds, S1 normal and S2 normal.     Comments: No murmurs. No LE edema or calf tenderness.  Pulmonary:     Effort: Pulmonary effort is normal.     Breath sounds: Normal breath sounds.  Chest:     Chest wall: Tenderness present.     Comments: Right lateral mid axillary and posterior rib tenderness.  Skin is normal.  Pain is reproduced when sitting up and taking deep breaths during exam, laying flat. Abdominal:     General: Bowel sounds are normal.     Palpations: Abdomen is soft.     Tenderness: There is no abdominal tenderness.     Comments: No epigastric or upper abdominal tenderness.  Musculoskeletal:     Cervical back: Normal range  of motion.  Skin:    General: Skin is warm and dry.     Capillary Refill: Capillary refill takes less than 2 seconds.     Comments: No rash to chest wall  Neurological:     Mental Status: He is alert.     GCS: GCS eye subscore is 4. GCS verbal subscore is 5. GCS motor subscore is 6.     Comments: Sensation and strength intact  in upper/lower extremities  Psychiatric:        Speech: Speech normal.        Behavior: Behavior normal.        Thought Content: Thought content normal.     ED Results / Procedures / Treatments   Labs (all labs ordered are listed, but only abnormal results are displayed) Labs Reviewed  BASIC METABOLIC PANEL - Abnormal; Notable for the following components:      Result Value   Glucose, Bld 249 (*)    All other components within normal limits  D-DIMER, QUANTITATIVE (NOT AT [facility]) - Abnormal; Notable for the following components:   D-Dimer, Quant 1.69 (*)    All other components within normal limits  TROPONIN I (HIGH SENSITIVITY) - Abnormal; Notable for the following components:   Troponin I (High Sensitivity) 28 (*)    All other components within normal limits  TROPONIN I (HIGH SENSITIVITY) - Abnormal; Notable for the following components:   Troponin I (High Sensitivity) 25 (*)    All other components within normal limits  SARS CORONAVIRUS 2 BY RT PCR (HOSPITAL ORDER, PERFORMED IN [facility])  CBC  LIPASE, BLOOD  HEPATIC FUNCTION PANEL  URINALYSIS, ROUTINE W REFLEX MICROSCOPIC    EKG EKG Interpretation  Date/Time:  Monday June 20 2020 14:34:36 EDT Ventricular Rate:  86 PR Interval:  142 QRS Duration: 82 QT Interval:  326 QTC Calculation: 390 R Axis:   54 Text Interpretation: Normal sinus rhythm Nonspecific T wave abnormality Abnormal ECG When compared to prior, new t wave inversion and appearance of V4 and V5. No STEMI Confirmed by Theda Belfast ([id number]) on 06/20/2020 10:35:29 PM   Radiology DG Chest 2 View  Result Date:  06/20/2020 CLINICAL DATA:  Intermittent right sided chest pain for 2 weeks. EXAM: CHEST - 2 VIEW COMPARISON:  04/08/2000 FINDINGS: The heart size and mediastinal contours are within normal limits. Both lungs are clear. No pleural effusion or pneumothorax. The visualized skeletal structures are unremarkable. IMPRESSION: Normal chest radiographs. Electronically Signed   By: Amie Portland M.D.   On: 06/20/2020 15:01    Procedures Procedures (including critical care time)  Medications Ordered in ED Medications  lidocaine (LIDODERM) 5 % 1 patch (1 patch Transdermal Patch Applied 06/20/20 2251)  iohexol (OMNIPAQUE) 350 MG/ML injection 100 mL (has no administration in time range)  morphine 4 MG/ML injection 4 mg (4 mg Intravenous Given 06/20/20 2250)    ED Course  I have reviewed the triage vital signs and the nursing notes.  Pertinent labs & imaging results that were available during my care of the patient were reviewed by me and considered in my medical decision making (see chart for details).  Clinical Course as of Jun 20 2354  [facility] Jun 20, 2020  2204 28 > 25   Troponin I (High Sensitivity)(!): 25 [CG]  2309 Laurance Flatten Cards fellow has reviewed EKG, suspects early repol esp due to AA, fit, young man. Unconcerned about EKG changes. Agrees with d-dimer.  Recommends cardiology f/u for echo as OP.  Does not think admission necessary.  Might try to come by to do bedside echo. Will message scheduler to help arrange follow up as OP for patient.    [CG]  2334 D-Dimer, Quant(!): 1.69 [CG]    Clinical Course User Index [CG] [place] Handy, PA-C   MDM Rules/Calculators/A&P  29 year old healthy young male presents to the ER for right-sided chest pain for 2 weeks.  Clinical presentation is very atypical.  Chest pain is constant, positional, pleuritic, reproducible.  Denies any other symptoms.  No red flags like recent upper respiratory or GI illness.  No IV drug use.  No  fever, cough, upper respiratory infection symptoms, hemoptysis.  No abdominal pain.  Exam reveals reproducible chest wall tenderness but otherwise benign.  No murmur.  Lungs clear.  No right upper quadrant abdominal tenderness.  No CVA tenderness.  No loss of distal pulses or neuro deficits.  ER work-up initiated in triage including CBC, BMP, troponin x2, EKG and chest x-ray.  These were personally reviewed and interpreted.  Troponin 28 > 25.  EKG reviewed with EDP Tegeler, some concern for V4, V5 T wave changes possible early repol.  Chest x-ray is nonacute, no rib fractures, pneumothorax, widening of mediastinum, free air, infection.  I have ordered LFTs, lipase, urinalysis.?  Gallbladder or renal stone presenting atypically.  Given elevation in troponin, also considering post viral pericarditis or myocarditis.  Covid test has been ordered.  Patient has no risk factors for PE.  Technically Wells score is 0 and PERC negative however given troponin elevation in description of pain, will add a D-dimer.  His tenderness is very easily reproducible with palpation and appears superficial however.  Given questionable EKG changes, troponin elevation I consulted cardiology.  I spoke to cardiology fellow, see above.   0000: LFTs, lipase normal.  Covid is negative.  D-dimer elevated.  I have ordered a CTA to rule out a PE.  Urinalysis pending.  Bedside ultrasound performed with EDP at bedside does not show pericardial effusion.  Squeeze appears overall adequate.    We will handed patient off to oncoming ED PA who will follow up on CTA, reassess, determine disposition. Final Clinical Impression(s) / ED Diagnoses Final diagnoses:  Right-sided chest pain    Rx / DC Orders ED Discharge Orders    None       [place] Handy, PA-C 06/21/20 0001    Tegeler, Canary Brim, MD 06/21/20 0010

## 2020-06-21 LAB — URINALYSIS, ROUTINE W REFLEX MICROSCOPIC
Bacteria, UA: NONE SEEN
Bilirubin Urine: NEGATIVE
Glucose, UA: >=500 mg/dL — AB
Hgb urine dipstick: NEGATIVE
Ketones, ur: NEGATIVE mg/dL
Leukocytes,Ua: NEGATIVE
Nitrite: NEGATIVE
Protein, ur: NEGATIVE mg/dL
Specific Gravity, Urine: 1.032 — ABNORMAL HIGH (ref 1.005–1.030)
pH: 6 (ref 5.0–8.0)

## 2020-06-21 NOTE — ED Provider Notes (Signed)
Care assumed from [facility].  Please see her full H&P.  In short,  Alan Burke is a 29 y.o. male presents for right-sided chest pain, rib pain, flank and back pain onset 2 weeks ago but worsening recently.  Worse when lying flat on his back and on his right side.  Not vaccinated for Covid.  No previous known Covid.  Work-up by initial provider showed slightly elevated troponins, stable here in the emergency department and some EKG changes  Initial provider reviewed case with cardiology who felt EKG was likely normal repol.  Suggested PE work-up.  Elevated D-dimer.  CT angiogram chest ordered.  This is pending at sign out.  Physical Exam  BP 118/80   Pulse 71   Temp 98.1 F (36.7 C) (Oral)   Resp 17   Ht 5\' 8"  (1.727 m)   Wt 82 kg   SpO2 98%   BMI 27.49 kg/m   Physical Exam Vitals and nursing note reviewed.  Constitutional:      General: He is not in acute distress.    Appearance: He is well-developed.  HENT:     Head: Normocephalic.  Eyes:     General: No scleral icterus.    Conjunctiva/sclera: Conjunctivae normal.  Cardiovascular:     Rate and Rhythm: Normal rate.  Pulmonary:     Effort: Pulmonary effort is normal.  Musculoskeletal:        General: Normal range of motion.     Cervical back: Normal range of motion.  Skin:    General: Skin is warm and dry.  Neurological:     Mental Status: He is alert.     ED Course/Procedures   Clinical Course as of Jun 21 209  05-23-1970 Jun 20, 2020  2204 28 > 25   Troponin I (High Sensitivity)(!): 25 [CG]  2309 2310 Cards fellow has reviewed EKG, suspects early repol esp due to AA, fit, young man. Unconcerned about EKG changes. Agrees with d-dimer.  Recommends cardiology f/u for echo as OP.  Does not think admission necessary.  Might try to come by to do bedside echo. Will message scheduler to help arrange follow up as OP for patient.    [CG]  2334 D-Dimer, Quant(!): 1.69 [CG]    Clinical Course User Index [CG]  2335     DG Chest 2 View  Result Date: 06/20/2020 CLINICAL DATA:  Intermittent right sided chest pain for 2 weeks. EXAM: CHEST - 2 VIEW COMPARISON:  04/08/2000 FINDINGS: The heart size and mediastinal contours are within normal limits. Both lungs are clear. No pleural effusion or pneumothorax. The visualized skeletal structures are unremarkable. IMPRESSION: Normal chest radiographs. Electronically Signed   By: 04/10/2000 M.D.   On: 06/20/2020 15:01   CT Angio Chest PE W and/or Wo Contrast  Result Date: 06/21/2020 CLINICAL DATA:  Right-sided intermittent chest pain for 2 weeks. EXAM: CT ANGIOGRAPHY CHEST WITH CONTRAST TECHNIQUE: Multidetector CT imaging of the chest was performed using the standard protocol during bolus administration of intravenous contrast. Multiplanar CT image reconstructions and MIPs were obtained to evaluate the vascular anatomy. CONTRAST:  08/21/2020 OMNIPAQUE IOHEXOL 350 MG/ML SOLN COMPARISON:  None. FINDINGS: Cardiovascular: Satisfactory opacification of the pulmonary arteries to the segmental level. No evidence of pulmonary embolism. Normal heart size. No pericardial effusion. Mediastinum/Nodes: No enlarged mediastinal, hilar, or axillary lymph nodes. Thyroid gland, trachea, and esophagus demonstrate no significant findings. Lungs/Pleura: 2 adjacent nodules are demonstrated in the superior segment of the right  lower lobe, each measuring about 4 mm diameter. There is another nodule in the right middle lobe measuring about 6 mm in diameter. This nodule is associated with the minor fissure and likely represents a fissural lymph node. No other nodules are demonstrated. No consolidation or edema. No pleural effusions. No pneumothorax. Airways are patent. Upper Abdomen: No acute abnormality. Musculoskeletal: No chest wall abnormality. No acute or significant osseous findings. Review of the MIP images confirms the above findings. IMPRESSION: 1. No evidence of significant  pulmonary embolus. 2. No evidence of active pulmonary disease. 3. Right lung nodules, largest measuring 6 mm in diameter, likely representing a fissural lymph node. Based on size, location, and patient age, these are likely benign. Electronically Signed   By: Burman Nieves M.D.   On: 06/21/2020 00:20     Procedures  MDM   01:00 AM CT angiogram without evidence of pulmonary embolism.  Cardiology will see and perform bedside echo here in the emergency department.  2:00 AM Cardiology reports largely normal bedside echo.  Less concern for cardiac etiology.  She recommends cardiology and endocrinology follow-up.  Feels he is safe for discharge.  2:16 AM  Reviewed discharge and follow-up instructions with patient.  He states understanding and is in agreement with the plan.  Right-sided chest pain  Atypical chest pain     Tecla Mailloux, Boyd Kerbs 06/21/20 0220    Gilda Crease, MD 06/21/20 [phone]

## 2020-06-21 NOTE — Discharge Instructions (Signed)
1. Medications: usual home medications 2. Treatment: rest, drink plenty of fluids,  3. Follow Up: Please followup with endocrinology and cardiology this week for discussion of your diagnoses and further evaluation after today's visit; if you do not have a primary care doctor use the resource guide provided to find one; Please return to the ER for new or worsening symptoms.

## 2020-07-21 NOTE — Progress Notes (Deleted)
Cardiology Office Note:   Date:  07/21/2020  NAME:  Alan Burke    MRN: [id number] DOB:  [birth year]   PCP:  Patient, No Pcp Per  Cardiologist:  No primary care provider on file.  Electrophysiologist:  None   Referring MD: No ref. provider found   No chief complaint on file. ***  History of Present Illness:   Alan Burke is a 29 y.o. male with a hx of diabetes who is being seen today for the evaluation of chest pain. Seen in the ER 9/6 for atypical CP. CT PE negative. Troponin negative for ACS. EKG with non-specific STT changes. Follow-up today.   Past Medical History: Past Medical History:  Diagnosis Date  . Diabetes mellitus without complication [facility])     Past Surgical History: No past surgical history on file.  Current Medications: No outpatient medications have been marked as taking for the 07/22/20 encounter (Appointment) with O'Neal, Ronnald Ramp, MD.     Allergies:    Patient has no known allergies.   Social History: Social History   Socioeconomic History  . Marital status: Single    Spouse name: Not on file  . Number of children: Not on file  . Years of education: Not on file  . Highest education level: Not on file  Occupational History  . Not on file  Tobacco Use  . Smoking status: Never Smoker  . Smokeless tobacco: Never Used  Vaping Use  . Vaping Use: Never used  Substance and Sexual Activity  . Alcohol use: Yes  . Drug use: No  . Sexual activity: Not on file  Other Topics Concern  . Not on file  Social History Narrative  . Not on file   Social Determinants of Health   Financial Resource Strain:   . Difficulty of Paying Living Expenses: Not on file  Food Insecurity:   . Worried About Programme researcher, broadcasting/film/video in the Last Year: Not on file  . Ran Out of Food in the Last Year: Not on file  Transportation Needs:   . Lack of Transportation (Medical): Not on file  . Lack of Transportation (Non-Medical): Not on file  Physical Activity:   . Days of  Exercise per Week: Not on file  . Minutes of Exercise per Session: Not on file  Stress:   . Feeling of Stress : Not on file  Social Connections:   . Frequency of Communication with Friends and Family: Not on file  . Frequency of Social Gatherings with Friends and Family: Not on file  . Attends Religious Services: Not on file  . Active Member of Clubs or Organizations: Not on file  . Attends Banker Meetings: Not on file  . Marital Status: Not on file     Family History: The patient's ***family history is not on file.  ROS:   All other ROS reviewed and negative. Pertinent positives noted in the HPI.     EKGs/Labs/Other Studies Reviewed:   The following studies were personally reviewed by me today:  EKG:  EKG is *** ordered today.  The ekg ordered today demonstrates ***, and was personally reviewed by me.   Recent Labs: 06/20/2020: ALT 22; BUN 14; Creatinine, Ser 1.03; Hemoglobin 14.1; Platelets 196; Potassium 4.1; Sodium 136   Recent Lipid Panel No results found for: CHOL, TRIG, HDL, CHOLHDL, VLDL, LDLCALC, LDLDIRECT  Physical Exam:   VS:  There were no vitals taken for this visit.   Wt Readings from Last 3 Encounters:  06/20/20 180 lb 12.4 oz (82 kg)  01/21/20 170 lb (77.1 kg)  12/08/18 175 lb (79.4 kg)    General: Well nourished, well developed, in no acute distress Heart: Atraumatic, normal size  Eyes: PEERLA, EOMI  Neck: Supple, no JVD Endocrine: No thryomegaly Cardiac: Normal S1, S2; RRR; no murmurs, rubs, or gallops Lungs: Clear to auscultation bilaterally, no wheezing, rhonchi or rales  Abd: Soft, nontender, no hepatomegaly  Ext: No edema, pulses 2+ Musculoskeletal: No deformities, BUE and BLE strength normal and equal Skin: Warm and dry, no rashes   Neuro: Alert and oriented to person, place, time, and situation, CNII-XII grossly intact, no focal deficits  Psych: Normal mood and affect   ASSESSMENT:   [name] is a 29 y.o. male who presents  for the following: No diagnosis found.  PLAN:   There are no diagnoses linked to this encounter.  Disposition: No follow-ups on file.  Medication Adjustments/Labs and Tests Ordered: Current medicines are reviewed at length with the patient today.  Concerns regarding medicines are outlined above.  No orders of the defined types were placed in this encounter.  No orders of the defined types were placed in this encounter.   There are no Patient Instructions on file for this visit.   Time Spent with Patient: I have spent a total of *** minutes with patient reviewing hospital notes, telemetry, EKGs, labs and examining the patient as well as establishing an assessment and plan that was discussed with the patient.  > 50% of time was spent in direct patient care.  Signed, Lenna Gilford. Flora Lipps, MD [facility]  [address], [address] [place], [place] [address] [phone]  07/21/2020 7:44 PM

## 2020-07-22 ENCOUNTER — Ambulatory Visit: Payer: Medicaid Other | Admitting: Cardiovascular Disease

## 2020-07-22 ENCOUNTER — Ambulatory Visit (INDEPENDENT_AMBULATORY_CARE_PROVIDER_SITE_OTHER): Payer: Medicaid Other | Admitting: Cardiovascular Disease

## 2020-07-22 ENCOUNTER — Encounter: Payer: Self-pay | Admitting: Cardiovascular Disease

## 2020-07-22 ENCOUNTER — Other Ambulatory Visit: Payer: Self-pay

## 2020-07-22 VITALS — BP 118/80 | HR 76 | Ht 68.0 in | Wt 169.0 lb

## 2020-07-22 DIAGNOSIS — R079 Chest pain, unspecified: Secondary | ICD-10-CM

## 2020-07-22 DIAGNOSIS — E109 Type 1 diabetes mellitus without complications: Secondary | ICD-10-CM

## 2020-07-22 NOTE — Patient Instructions (Signed)
Medication Instructions:  The current medical regimen is effective;  continue present plan and medications.  *If you need a refill on your cardiac medications before your next appointment, please call your pharmacy*   Lab Work: SED, CRP, A1C  If you have labs (blood work) drawn today and your tests are completely normal, you will receive your results only by: Marland Kitchen MyChart Message (if you have MyChart) OR . A paper copy in the mail If you have any lab test that is abnormal or we need to change your treatment, we will call you to review the results.   Follow-Up: At [facility], you and your health needs are our priority.  As part of our continuing mission to provide you with exceptional heart care, we have created designated Provider Care Teams.  These Care Teams include your primary Cardiologist (physician) and Advanced Practice Providers (APPs -  Physician Assistants and Nurse Practitioners) who all work together to provide you with the care you need, when you need it.  We recommend signing up for the patient portal called "MyChart".  Sign up information is provided on this After Visit Summary.  MyChart is used to connect with patients for Virtual Visits (Telemedicine).  Patients are able to view lab/test results, encounter notes, upcoming appointments, etc.  Non-urgent messages can be sent to your provider as well.   To learn more about what you can do with MyChart, go to [email or web address].    Your next appointment:   PRN  The format for your next appointment:   In Person  Provider:   Lennie Odor, MD   Other Instructions Information to [facility] and Wellness-  [address] [place],  [place]  [address] Main: [phone]

## 2020-07-22 NOTE — Progress Notes (Signed)
Cardiology Office Note:   Date:  07/22/2020  NAME:  Alan Burke    MRN: [id number] DOB:  Jan 14, 1991   PCP:  Patient, No Pcp Per  Cardiologist:  No primary care provider on file.  Electrophysiologist:  None   Referring MD: No ref. provider found   Chief Complaint  Patient presents with  . Chest Pain   History of Present Illness:   Alan Burke is a 29 y.o. male with a hx of diabetes who is being seen today for the evaluation of chest pain.  Recently evaluated in the emergency room on 06/20/2020 for right-sided chest pain.  CT PE study negative.  Troponins were negative on her old system.  EKG demonstrates likely LVH.  He presents for the evaluation of chest pain.  He reports he has had on and off chest pain for nearly 1 year.  He reports it is described as sharp chest pain in the left side.  The pain can radiate into his right chest and into his right flank.  Can also be achy at times.  Occurs several times per day.  He reports it can last minutes to hours.  No identifiable trigger.  Mainly occurs at rest.  He reports it goes away without any intervention other than deep breathing.  He reports breathing does not make it worse.  He reports stress makes it worse.  His EKG today demonstrates normal sinus rhythm with LVH changes.  Likely athletic.  I did perform a bedside echocardiogram which shows normal LV function and normal wall thickness.  He reports that he exercises daily.  He can do cardio and weightlifting for 45 minutes to 1 hour without any symptoms of chest pain.  The symptoms always occur at rest.  He apparently is a felon in the past.  He is having trouble getting a job.  He works in Scientist, water quality now.  He travels up and down the [place] jobs.  He reports he has 2 children.  He reports financial stresses as well as issues with health insurance.  Apparently he was diagnosed with type 1 diabetes at age 77 in [place].  He is not had insurance at that time and not been on any  medications.  Apparently he is taking Humulin from his mother at times.  He reports has been admitted several times up [place] and here for hyperglycemia.  No recent A1c value.  His kidney panel is normal.  He does not smoke drink or use drugs.  He reports that his grandfather may have had heart disease.  Past Medical History: Past Medical History:  Diagnosis Date  . Diabetes mellitus without complication [facility])     Past Surgical History: Past Surgical History:  Procedure Laterality Date  . HAND SURGERY      Current Medications: Current Meds  Medication Sig  . amoxicillin (AMOXIL) 500 MG capsule Take 1 capsule (500 mg total) by mouth 3 (three) times daily.  . clindamycin (CLEOCIN) 150 MG capsule Take 2 capsules (300 mg total) by mouth every 6 (six) hours.  Marland Kitchen HYDROcodone-acetaminophen (NORCO) 5-325 MG tablet Take 1-2 tablets by mouth every 4 (four) hours as needed.  Marland Kitchen ibuprofen (ADVIL,MOTRIN) 800 MG tablet Take 1 tablet (800 mg total) by mouth every 6 (six) hours as needed.  . insulin NPH-regular Human (70-30) 100 UNIT/ML injection Inject 40 Units into the skin 2 (two) times daily with a meal.  . lidocaine (XYLOCAINE) 2 % solution Use as directed 15 mLs in the mouth or  throat as needed for mouth pain. Or can apply topically with q-tip/cotton ball.  . magic mouthwash w/lidocaine SOLN Take 10 mLs by mouth 3 (three) times daily.  . meloxicam (MOBIC) 15 MG tablet Take 1 tablet (15 mg total) by mouth daily.  . naproxen (NAPROSYN) 500 MG tablet Take 1 tablet (500 mg total) by mouth 2 (two) times daily with a meal.  . ondansetron (ZOFRAN ODT) 4 MG disintegrating tablet Take 1 tablet (4 mg total) by mouth every 8 (eight) hours as needed for nausea or vomiting.  . penicillin v potassium (VEETID) 500 MG tablet Take 1 tablet (500 mg total) by mouth 4 (four) times daily.  . traMADol (ULTRAM) 50 MG tablet Take 1 tablet (50 mg total) by mouth every 6 (six) hours as needed.     Allergies:    Patient has  no known allergies.   Social History: Social History   Socioeconomic History  . Marital status: Single    Spouse name: Not on file  . Number of children: 2  . Years of education: Not on file  . Highest education level: Not on file  Occupational History  . Occupation: photography  Tobacco Use  . Smoking status: Never Smoker  . Smokeless tobacco: Never Used  Vaping Use  . Vaping Use: Never used  Substance and Sexual Activity  . Alcohol use: Never  . Drug use: No  . Sexual activity: Not on file  Other Topics Concern  . Not on file  Social History Narrative  . Not on file   Social Determinants of Health   Financial Resource Strain:   . Difficulty of Paying Living Expenses: Not on file  Food Insecurity:   . Worried About Charity fundraiser in the Last Year: Not on file  . Ran Out of Food in the Last Year: Not on file  Transportation Needs:   . Lack of Transportation (Medical): Not on file  . Lack of Transportation (Non-Medical): Not on file  Physical Activity:   . Days of Exercise per Week: Not on file  . Minutes of Exercise per Session: Not on file  Stress:   . Feeling of Stress : Not on file  Social Connections:   . Frequency of Communication with Friends and Family: Not on file  . Frequency of Social Gatherings with Friends and Family: Not on file  . Attends Religious Services: Not on file  . Active Member of Clubs or Organizations: Not on file  . Attends Archivist Meetings: Not on file  . Marital Status: Not on file     Family History: The patient's family history includes Heart disease in his paternal grandfather.  ROS:   All other ROS reviewed and negative. Pertinent positives noted in the HPI.     EKGs/Labs/Other Studies Reviewed:   The following studies were personally reviewed by me today:  EKG:  EKG is ordered today.  The ekg ordered today demonstrates normal sinus rhythm, heart rate 72, LVH by voltage, and was personally reviewed by me.    Recent Labs: 06/20/2020: ALT 22; BUN 14; Creatinine, Ser 1.03; Hemoglobin 14.1; Platelets 196; Potassium 4.1; Sodium 136   Recent Lipid Panel No results found for: CHOL, TRIG, HDL, CHOLHDL, VLDL, LDLCALC, LDLDIRECT  Physical Exam:   VS:  BP 118/80   Pulse 76   Ht '5\' 8"'  (1.727 m)   Wt 169 lb (76.7 kg)   SpO2 100%   BMI 25.70 kg/m    Wt Readings from  Last 3 Encounters:  07/22/20 169 lb (76.7 kg)  06/20/20 180 lb 12.4 oz (82 kg)  01/21/20 170 lb (77.1 kg)    General: Well nourished, well developed, in no acute distress Heart: Atraumatic, normal size  Eyes: PEERLA, EOMI  Neck: Supple, no JVD Endocrine: No thryomegaly Cardiac: Normal S1, S2; RRR; no murmurs, rubs, or gallops Lungs: Clear to auscultation bilaterally, no wheezing, rhonchi or rales  Abd: Soft, nontender, no hepatomegaly  Ext: No edema, pulses 2+ Musculoskeletal: No deformities, BUE and BLE strength normal and equal Skin: Warm and dry, no rashes   Neuro: Alert and oriented to person, place, time, and situation, CNII-XII grossly intact, no focal deficits  Psych: Normal mood and affect   ASSESSMENT:   Moise Friday is a 29 y.o. male who presents for the following: 1. Chest pain, unspecified type   2. Type 1 diabetes mellitus without complication (HCC)     PLAN:   1. Chest pain, unspecified type -Atypical chest pain described as sharp stabbing pain in the left chest that can go into the right chest.  Occurs mainly stress.  Never with exertion.  Main risk factors are uncontrolled diabetes.  See below.  He can exercise for up to 1 hour without any limitations.  I did perform a quick bedside echocardiogram with point-of-care ultrasound today in office which demonstrates normal LV function normal wall thickness.  His troponins were normal in my opinion on his recent mission to the emergency room.  I have a low suspicion for effective CAD.  He has no symptoms to describe pericarditis.  We will check an ESR and CRP just to  ensure he does not have any inflammation.  Given that they are stress related meaning his symptoms occur with stress mainly, I would not recommend further testing at this time.  I also have concerns that he is having significant hyperglycemia.  This could easily explain all of his symptoms.  I would like for him to get plugged in with a diabetes doctor see below.  2. Type 1 diabetes mellitus without complication ([place]) -He reports 10+ years of type 1 diabetes without treatment.  We will check an A1c today.  I have given him information about the [facility].  He needs to get plugged in with a primary care physician to treat his diabetes.  He needs to be on several medications.  I will refer him today.  Regarding his chest pain he will see [place] as needed.  Disposition: Return if symptoms worsen or fail to improve.  Medication Adjustments/Labs and Tests Ordered: Current medicines are reviewed at length with the patient today.  Concerns regarding medicines are outlined above.  Orders Placed This Encounter  Procedures  . Sed Rate (ESR)  . C-reactive protein  . Hemoglobin A1c  . EKG 12-Lead   No orders of the defined types were placed in this encounter.   Patient Instructions  Medication Instructions:  The current medical regimen is effective;  continue present plan and medications.  *If you need a refill on your cardiac medications before your next appointment, please call your pharmacy*   Lab Work: SED, CRP, A1C  If you have labs (blood work) drawn today and your tests are completely normal, you will receive your results only by: Marland Kitchen MyChart Message (if you have MyChart) OR . A paper copy in the mail If you have any lab test that is abnormal or we need to change your treatment, we will call you  to review the results.   Follow-Up: At [facility], you and your health needs are our priority.  As part of our continuing mission to provide you with exceptional heart  care, we have created designated Provider Care Teams.  These Care Teams include your primary Cardiologist (physician) and Advanced Practice Providers (APPs -  Physician Assistants and Nurse Practitioners) who all work together to provide you with the care you need, when you need it.  We recommend signing up for the patient portal called "MyChart".  Sign up information is provided on this After Visit Summary.  MyChart is used to connect with patients for Virtual Visits (Telemedicine).  Patients are able to view lab/test results, encounter notes, upcoming appointments, etc.  Non-urgent messages can be sent to your provider as well.   To learn more about what you can do with MyChart, go to [email or web address].    Your next appointment:   PRN  The format for your next appointment:   In Person  Provider:   Eleonore Chiquito, MD   Other Instructions Information to [facility] and [place]  [address] [place],  [place]  [address] Main: [phone]    Signed, Addison Naegeli. Audie Box, [employer]  [address], [place], [place] [address] [phone]  07/22/2020 2:13 PM

## 2020-07-23 LAB — C-REACTIVE PROTEIN: CRP: <1 mg/L (ref 0–10)

## 2020-07-23 LAB — HEMOGLOBIN A1C
Est. average glucose Bld gHb Est-mCnc: 269 mg/dL
Hgb A1c MFr Bld: 11 % — ABNORMAL HIGH (ref 4.8–5.6)

## 2020-07-23 LAB — SEDIMENTATION RATE: Sed Rate: 35 mm/hr — ABNORMAL HIGH (ref 0–15)

## 2020-08-11 ENCOUNTER — Telehealth: Payer: Self-pay | Admitting: Cardiovascular Disease

## 2020-08-11 NOTE — Telephone Encounter (Signed)
? ?  Pt returning call to get lab result ?

## 2020-08-11 NOTE — Telephone Encounter (Signed)
I spoke with patient and reviewed recent lab results with him.  Contact information for [facility] [facility]ness office given to patient.

## 2020-08-15 ENCOUNTER — Emergency Department (HOSPITAL_COMMUNITY)
Admission: EM | Admit: 2020-08-15 | Discharge: 2020-08-15 | Disposition: A | Discharge status: Home / Self Care | Payer: Medicaid Other | Attending: Emergency Medicine | Admitting: Emergency Medicine

## 2020-08-15 ENCOUNTER — Encounter (HOSPITAL_COMMUNITY): Payer: Self-pay

## 2020-08-15 ENCOUNTER — Other Ambulatory Visit: Payer: Self-pay

## 2020-08-15 DIAGNOSIS — Z794 Long term (current) use of insulin: Secondary | ICD-10-CM | POA: Insufficient documentation

## 2020-08-15 DIAGNOSIS — Z202 Contact with and (suspected) exposure to infections with a predominantly sexual mode of transmission: Secondary | ICD-10-CM | POA: Insufficient documentation

## 2020-08-15 DIAGNOSIS — E119 Type 2 diabetes mellitus without complications: Secondary | ICD-10-CM | POA: Insufficient documentation

## 2020-08-15 NOTE — ED Triage Notes (Signed)
Patient arrived stating that he had intercourse with someone who states they have herpes. Declines any symptoms.

## 2020-08-15 NOTE — ED Provider Notes (Signed)
[facility]-EMERGENCY DEPT Provider Note   CSN: [id number] Arrival date & time: 08/15/20  1926     History Chief Complaint  Patient presents with  . Exposure to STD    Alan Burke is a 29 y.o. male.  HPI   Patient with significant medical history of diabetes presents to the emergency department with chief complaint of possible STD exposure.  Patient states his significant other told him that she thinks she might have a STD and that he should be checked out.  He denies urinary symptoms, urinary urgency, frequency, dysuria, hematuria, he denies penile discharge or testicular pain.  He states that his girlfriend actually has UTI but came here because he wants to be fully checked out.  He denies rashes, oral lesions, fevers, chills, shortness of breath, chest pain, dumping, nausea, vomiting, diarrhea, pedal edema.  Past Medical History:  Diagnosis Date  . Diabetes mellitus without complication (HCC)     There are no problems to display for this patient.   Past Surgical History:  Procedure Laterality Date  . HAND SURGERY         Family History  Problem Relation Age of Onset  . Heart disease Paternal Grandfather     Social History   Tobacco Use  . Smoking status: Never Smoker  . Smokeless tobacco: Never Used  Vaping Use  . Vaping Use: Never used  Substance Use Topics  . Alcohol use: Never  . Drug use: No    Home Medications Prior to Admission medications   Medication Sig Start Date End Date Taking? Authorizing Provider  amoxicillin (AMOXIL) 500 MG capsule Take 1 capsule (500 mg total) by mouth 3 (three) times daily. 10/03/17   Arthor Captain, PA-C  clindamycin (CLEOCIN) 150 MG capsule Take 2 capsules (300 mg total) by mouth every 6 (six) hours. 10/05/18   Kirichenko, Lemont Fillers, PA-C  HYDROcodone-acetaminophen (NORCO) 5-325 MG tablet Take 1-2 tablets by mouth every 4 (four) hours as needed. 08/25/16   Harris, Cammy Copa, PA-C  ibuprofen  (ADVIL,MOTRIN) 800 MG tablet Take 1 tablet (800 mg total) by mouth every 6 (six) hours as needed. 10/05/18   Kirichenko, Tatyana, PA-C  insulin NPH-regular Human (70-30) 100 UNIT/ML injection Inject 40 Units into the skin 2 (two) times daily with a meal.    [provider]  lidocaine (XYLOCAINE) 2 % solution Use as directed 15 mLs in the mouth or throat as needed for mouth pain. Or can apply topically with q-tip/cotton ball. 09/16/18   Garlon Hatchet, PA-C  magic mouthwash w/lidocaine SOLN Take 10 mLs by mouth 3 (three) times daily. 12/09/18   Coralyn Mark, NP  meloxicam (MOBIC) 15 MG tablet Take 1 tablet (15 mg total) by mouth daily. 10/03/17   Arthor Captain, PA-C  naproxen (NAPROSYN) 500 MG tablet Take 1 tablet (500 mg total) by mouth 2 (two) times daily with a meal. 09/16/18   Garlon Hatchet, PA-C  ondansetron (ZOFRAN ODT) 4 MG disintegrating tablet Take 1 tablet (4 mg total) by mouth every 8 (eight) hours as needed for nausea or vomiting. 01/21/20   Pricilla Loveless, MD  penicillin v potassium (VEETID) 500 MG tablet Take 1 tablet (500 mg total) by mouth 4 (four) times daily. 09/16/18   Garlon Hatchet, PA-C  traMADol (ULTRAM) 50 MG tablet Take 1 tablet (50 mg total) by mouth every 6 (six) hours as needed. 12/09/18   Coralyn Mark, NP    Allergies    Patient has no known  allergies.  Review of Systems   Review of Systems  Constitutional: Negative for chills and fever.  HENT: Negative for congestion, sore throat, tinnitus and voice change.   Respiratory: Negative for shortness of breath.   Cardiovascular: Negative for chest pain.  Gastrointestinal: Negative for abdominal pain, diarrhea and nausea.  Genitourinary: Negative for dysuria, enuresis, flank pain, frequency, penile pain, penile swelling, scrotal swelling and testicular pain.  Musculoskeletal: Negative for back pain.  Skin: Negative for rash.  Neurological: Negative for dizziness and headaches.  Hematological: Does  not bruise/bleed easily.    Physical Exam Updated Vital Signs BP (!) 141/92 (BP Location: Right Arm)   Pulse (!) 109   Temp 98.1 F (36.7 C) (Oral)   Resp 18   Ht 5\' 8"  (1.727 m)   Wt 77.1 kg   SpO2 95%   BMI 25.85 kg/m   Physical Exam Vitals and nursing note reviewed. Exam conducted with a chaperone present.  Constitutional:      General: He is not in acute distress.    Appearance: Normal appearance. He is not ill-appearing or diaphoretic.  HENT:     Head: Normocephalic and atraumatic.     Nose: No congestion or rhinorrhea.  Eyes:     General: No scleral icterus.       Right eye: No discharge.        Left eye: No discharge.     Conjunctiva/sclera: Conjunctivae normal.  Pulmonary:     Effort: Pulmonary effort is normal.     Breath sounds: Normal breath sounds.  Genitourinary:    Penis: Normal.      Testes: Normal.     Comments: With a chaperone present genital exam was performed no rashes, lesions, penile discharge noted.  Testicles were palpated nontender to palpation, no bell clap deformity noted. Musculoskeletal:     Cervical back: Neck supple.     Right lower leg: No edema.     Left lower leg: No edema.  Skin:    General: Skin is warm and dry.     Coloration: Skin is not jaundiced or pale.     Findings: No lesion or rash.  Neurological:     Mental Status: He is alert and oriented to person, place, and time.  Psychiatric:        Mood and Affect: Mood normal.     ED Results / Procedures / Treatments   Labs (all labs ordered are listed, but only abnormal results are displayed) Labs Reviewed  RPR  HIV ANTIBODY (ROUTINE TESTING W REFLEX)  GC/CHLAMYDIA PROBE AMP ([place]) NOT AT [facility]    EKG None  Radiology No results found.  Procedures Procedures (including critical care time)  Medications Ordered in ED Medications - No data to display  ED Course  I have reviewed the triage vital signs and the nursing notes.  Pertinent labs & imaging  results that were available during my care of the patient were reviewed by me and considered in my medical decision making (see chart for details).    MDM Rules/Calculators/A&P                          Patient presents with STD exposure.  He was alert, did not appear in acute distress, vital signs reassuring.  Will obtain gonorrhea chlamydia as well as HIV and syphilis testing.  STD panel is pending at this time.  Low suspicion for pyelonephritis or UTI as patient denies urinary symptoms, lower  back pain, nausea vomiting fevers or chills.  Low suspicion for epididymitis or prostatitis as patient denies saddle pain, testicles are nontender and stable patient.  Low suspicion for herpes as there is no noted rash on patient's genitalia.  Low suspicion for STD as patient denies penile discharge, testicular pain, or rash around the genitalia.  Low suspicion for systemic infection as patient is nontoxic-appearing, vital signs reassuring, no obvious source infection noted on exam.  Will defer STD treatment at this time.  Will recommend patient abstain from sexual intercourse until his welts are back and encourage sex safe practices.  Vital signs have remained stable, no indication for hospital admission.   Patient given at home care as well strict return precautions.  Patient verbalized that they understood agreed to said plan.   Final Clinical Impression(s) / ED Diagnoses Final diagnoses:  STD exposure    Rx / DC Orders ED Discharge Orders    None       Carroll Sage, PA-C 08/15/20 2059    Charlynne Pander, MD 08/15/20 [phone]

## 2020-08-15 NOTE — Discharge Instructions (Addendum)
Seen here for STD exposure.  Exam looks reassuring.  Your gonorrhea, chlamydia, syphilis, HIV are pending at this time if they are positive the hospital call you.  You can also find this information on your MyChart account.  I recommend abstain from sexual intercourse until you get results back.  I recommend safe sex practices to avoid future exposures.  You can follow-up at the health department for further evaluation.  Come back to the emergency department if you develop chest pain, shortness of breath, severe abdominal pain, uncontrolled nausea, vomiting, diarrhea.

## 2020-08-15 NOTE — ED Notes (Signed)
Pt reports his partner did not test positive for herpes. He states his partner has a UTI but he would like to be tested for his own security.

## 2020-08-15 NOTE — ED Notes (Addendum)
Patient left before the discharge process could be completed and without discharge papers.

## 2020-08-16 LAB — GC/CHLAMYDIA PROBE AMP (~~LOC~~) NOT AT ARMC
Chlamydia: NEGATIVE
Comment: NEGATIVE
Comment: NORMAL
Neisseria Gonorrhea: NEGATIVE

## 2020-08-16 LAB — RPR: RPR Ser Ql: NONREACTIVE

## 2020-08-16 LAB — HIV ANTIBODY (ROUTINE TESTING W REFLEX): HIV Screen 4th Generation wRfx: NONREACTIVE

## 2020-08-22 ENCOUNTER — Other Ambulatory Visit: Payer: Self-pay | Admitting: *Deleted

## 2020-08-22 DIAGNOSIS — E109 Type 1 diabetes mellitus without complications: Secondary | ICD-10-CM

## 2020-08-23 ENCOUNTER — Encounter: Payer: Self-pay | Admitting: Internal Medicine

## 2020-08-23 ENCOUNTER — Ambulatory Visit (INDEPENDENT_AMBULATORY_CARE_PROVIDER_SITE_OTHER): Payer: Medicaid Other | Admitting: Internal Medicine

## 2020-08-23 ENCOUNTER — Other Ambulatory Visit: Payer: Self-pay

## 2020-08-23 VITALS — BP 122/80 | HR 83 | Ht 68.0 in | Wt 174.4 lb

## 2020-08-23 DIAGNOSIS — Z794 Long term (current) use of insulin: Secondary | ICD-10-CM

## 2020-08-23 DIAGNOSIS — E1165 Type 2 diabetes mellitus with hyperglycemia: Secondary | ICD-10-CM

## 2020-08-23 MED ORDER — GLUCAGON EMERGENCY 1 MG IJ KIT: PACK | INTRAMUSCULAR | 11 refills | Status: AC

## 2020-08-23 MED ORDER — GLUCAGON 3 MG/DOSE NA POWD: 3.0000 mg | Freq: Once | NASAL | 11 refills | Status: AC | PRN

## 2020-08-23 MED ORDER — TRESIBA FLEXTOUCH 100 UNIT/ML ~~LOC~~ SOPN: PEN_INJECTOR | SUBCUTANEOUS | 0 refills | Status: DC

## 2020-08-23 MED ORDER — LYUMJEV KWIKPEN 100 UNIT/ML ~~LOC~~ SOPN: PEN_INJECTOR | SUBCUTANEOUS | 0 refills | Status: AC

## 2020-08-23 NOTE — Progress Notes (Signed)
Patient ID: Alan Burke, male   DOB: 02/27/1991, 29 y.o.   MRN: [id number]  This visit occurred during the SARS-CoV-2 public health emergency.  Safety protocols were in place, including screening questions prior to the visit, additional usage of staff PPE, and extensive cleaning of exam room while observing appropriate contact time as indicated for disinfecting solutions.   HPI: Alan Burke is a 29 y.o.-year-old male, referred by Dr. O'Neill, for management of DM1, diagnosed in 2001 (7th grade - Glu >900 in [place]), uncontrolled, with PN.  He previously saw endocrinology but not since he was 29 years old. He moved to [place] in 2017. He does not have insurance - but applied for this and will start next month.  He was in the emergency with hyperglycemia in 01/2020 and 082021.  Reviewed HbA1c levels: Lab Results  Component Value Date   HGBA1C 11.0 (H) 07/22/2020   Regimen: - ReliON NPH/Regular 70/30 vial (right now, using his mother's) 40 units twice a day right before breakfast and dinner  Meter: Freestyle Lite  Pt checks his sugars 4x a day and they are: - am: 80-130 - 2h after b'fast: n/c - before lunch: 190-200s - 2h after lunch: n/c - before dinner: 30s-60s if skating, or gym, OTW 80s-200 - 2h after dinner: n/c - bedtime: 200s-300 - nighttime: 30-200s Lowest sugar was 30s; he has hypoglycemia awareness at 30s occasionally.  He does not have glucagon kit at home. No previous hypoglycemia admission.  Highest sugar was HI. No previous DKA admissions.    Pt's meals are: - Breakfast: oatmeal + bagel - Lunch: homecooked: chilly with potatoes; egg and cheese on a bagel; beef patty - Dinner: homecooked: mac and cheese, chicken, fish - all baked - Snacks: candy He exercises every other day: gym, skating He is self-employed.  - no CKD, last BUN/creatinine:  Lab Results  Component Value Date   BUN 14 06/20/2020   BUN 14 04/08/2020   CREATININE 1.03 06/20/2020   CREATININE  0.92 04/08/2020   - + HL; last set of lipids: No results found for: CHOL, HDL, LDLCALC, LDLDIRECT, TRIG, CHOLHDL   - last eye exam was in 2020. ? DR, but was told that there was "a problem" with R eye (will send me the records).   - + numbness and tingling in his feet.  He had 5 teeth extracted recently. Cannot afford implants.  No hypothyroidism. Last TSH: No results found for: TSH  Pt has FH of DM in mother, MGF, MGM.  He used to dance hip-hop while in [place], at 17-18 years old.    ROS: Constitutional: + Weight gain, no weight loss, + fatigue, + hot flashes, no subjective hypothermia, no nocturia, + poor sleep Eyes: no blurry vision, no xerophthalmia ENT: no sore throat, no nodules palpated in neck, no dysphagia, no odynophagia, no hoarseness, no tinnitus, no hypoacusis Cardiovascular: + CP, no SOB, no palpitations, no leg swelling Respiratory: no cough, no SOB, no wheezing Gastrointestinal: no N, no V, no D, no C, no acid reflux Musculoskeletal: + muscle, + joint aches Skin: no rash, no hair loss, + itching Neurological: no tremors, + numbness or tingling/no dizziness/+ HAs Psychiatric: + Depression, + anxiety + Low libido  Past Medical History:  Diagnosis Date  . Diabetes mellitus without complication (HCC)    Past Surgical History:  Procedure Laterality Date  . HAND SURGERY     Social History   Socioeconomic History  . Marital status: Single    Spouse   name: Not on file  . Number of children: 2  . Years of education: Not on file  . Highest education level: Not on file  Occupational History  . Occupation: photography  Tobacco Use  . Smoking status: Never Smoker  . Smokeless tobacco: Never Used  Vaping Use  . Vaping Use: Never used  Substance and Sexual Activity  . Alcohol use: Never  . Drug use: No  . Sexual activity: Not on file  Other Topics Concern  . Not on file  Social History Narrative  . Not on file   Social Determinants of Health    Financial Resource Strain:   . Difficulty of Paying Living Expenses: Not on file  Food Insecurity:   . Worried About Running Out of Food in the Last Year: Not on file  . Ran Out of Food in the Last Year: Not on file  Transportation Needs:   . Lack of Transportation (Medical): Not on file  . Lack of Transportation (Non-Medical): Not on file  Physical Activity:   . Days of Exercise per Week: Not on file  . Minutes of Exercise per Session: Not on file  Stress:   . Feeling of Stress : Not on file  Social Connections:   . Frequency of Communication with Friends and Family: Not on file  . Frequency of Social Gatherings with Friends and Family: Not on file  . Attends Religious Services: Not on file  . Active Member of Clubs or Organizations: Not on file  . Attends Club or Organization Meetings: Not on file  . Marital Status: Not on file  Intimate Partner Violence:   . Fear of Current or Ex-Partner: Not on file  . Emotionally Abused: Not on file  . Physically Abused: Not on file  . Sexually Abused: Not on file   Current Outpatient Medications on File Prior to Visit  Medication Sig Dispense Refill  . amoxicillin (AMOXIL) 500 MG capsule Take 1 capsule (500 mg total) by mouth 3 (three) times daily. 21 capsule 0  . clindamycin (CLEOCIN) 150 MG capsule Take 2 capsules (300 mg total) by mouth every 6 (six) hours. 42 capsule 0  . HYDROcodone-acetaminophen (NORCO) 5-325 MG tablet Take 1-2 tablets by mouth every 4 (four) hours as needed. 12 tablet 0  . ibuprofen (ADVIL,MOTRIN) 800 MG tablet Take 1 tablet (800 mg total) by mouth every 6 (six) hours as needed. 30 tablet 0  . insulin NPH-regular Human (70-30) 100 UNIT/ML injection Inject 40 Units into the skin 2 (two) times daily with a meal.    . lidocaine (XYLOCAINE) 2 % solution Use as directed 15 mLs in the mouth or throat as needed for mouth pain. Or can apply topically with q-tip/cotton ball. 150 mL 0  . magic mouthwash w/lidocaine SOLN Take  10 mLs by mouth 3 (three) times daily. 50 mL 1  . meloxicam (MOBIC) 15 MG tablet Take 1 tablet (15 mg total) by mouth daily. 30 tablet 0  . naproxen (NAPROSYN) 500 MG tablet Take 1 tablet (500 mg total) by mouth 2 (two) times daily with a meal. 30 tablet 0  . ondansetron (ZOFRAN ODT) 4 MG disintegrating tablet Take 1 tablet (4 mg total) by mouth every 8 (eight) hours as needed for nausea or vomiting. 10 tablet 0  . penicillin v potassium (VEETID) 500 MG tablet Take 1 tablet (500 mg total) by mouth 4 (four) times daily. 40 tablet 0  . traMADol (ULTRAM) 50 MG tablet Take 1 tablet (50 mg   total) by mouth every 6 (six) hours as needed. 15 tablet 0   No current facility-administered medications on file prior to visit.   No Known Allergies Family History  Problem Relation Age of Onset  . Heart disease Paternal Grandfather     PE: BP 122/80   Pulse 83   Ht 5' 8" (1.727 m)   Wt 174 lb 6.4 oz (79.1 kg)   SpO2 97%   BMI 26.52 kg/m  Wt Readings from Last 3 Encounters:  08/23/20 174 lb 6.4 oz (79.1 kg)  08/15/20 170 lb (77.1 kg)  07/22/20 169 lb (76.7 kg)   Constitutional: normal weight, in NAD Eyes: PERRLA, EOMI, no exophthalmos ENT: moist mucous membranes, no thyromegaly, no cervical lymphadenopathy Cardiovascular: RRR, No MRG Respiratory: CTA B Gastrointestinal: abdomen soft, NT, ND, BS+ Musculoskeletal: no deformities, strength intact in all 4 Skin: moist, warm, no rashes Neurological: no tremor with outstretched hands, DTR normal in all 4  ASSESSMENT: 1. DM1, uncontrolled, with with complications -Peripheral neuropathy -Possible DR-we will need records  -Lack of insurance  PLAN:  1. Patient with long-standing, uncontrolled DM1.  At today's visit, we discussed about his uninsured status.  He is self-employed and already applied for an insurance and will start next month.  As of now, he is using 70/30 insulin regimen from [facility] and he is on large doses of insulin, taken right  before meals and as needed for correction.  We discussed that this is not an appropriate regimen for him due to the inflexibility of the regimen, the fact that it needs to be taken 30 minutes before meals, and is not really designed to correct high blood sugars or for patients to need higher doses of mealtime insulin.  Since he has approximately 1 more month until he can get a new insurance, we discussed about a basal-bolus insulin regimen and I gave him a starting regimen.  He is not counting carbs so for now we will base the dose of insulin on the size and consistency of his meals.  Since he is checking his sugars significantly even to the 30s when he is more active in the afternoon, we discussed about thinking a much lower dose of insulin if he plans to be active after that particular meal. We also discussed about stopping bagels and also candy and other sweets.  At next visit, I will refer him to nutrition for carb counting. -He is interested in a CGM and also on insulin pump.  We can discuss more about these at next visit, after he starts his insurance -At this visit, I gave him samples of Tresiba and Lyumjev - I suggested to: Patient Instructions   Please stop 70/30 insulin regimen and start: - Tresiba 36 units daily - Lyumjev  7-8 units before a meal if plan to be more active after the meal 10-14 units before regular meals  Please inject the insulin right before meals.  Try to stop sweets and also stop bagels.  Please return in 1.5 months with your sugar log.   - Strongly advised him to start checking sugars at different times of the day - check at least 4 times a day, rotating checks - given sugar log and advised how to fill it and to bring it at next appt. discussed about CBG targets - given foot care handout and explained the principles  - given instructions for hypoglycemia management "15-15 rule"  - advised for yearly eye exams - I sent glucagon kit Rx to  pharmacy -we will send  both an injectable and an intranasal glucagon - advised to get ketone strips - advised to always have Glu tablets with him - advised for a Med-alert bracelet mentioning "type 1 diabetes mellitus". - given instruction Re: exercising and driving in DM1 (pt instructions) - also, given information about sick day rules - no signs of other autoimmune disorders - will check annual labs at next visit. - Return to clinic in 1.5 mo with sugar log   Cristina Gherghe, MD PhD [facility]  

## 2020-08-23 NOTE — Patient Instructions (Addendum)
Please stop 70/30 insulin regimen and start: - Tresiba 36 units daily - Lyumjev  7-8 units before a meal if plan to be more active after the meal 10-14 units before regular meals  Please inject the insulin right before meals.  Try to stop sweets and also stop bagels.  Please return in 1.5 months with your sugar log.   Basic Rules for Patients with Type I Diabetes Mellitus  1. The American Diabetes Association (ADA) recommended targets: - fasting sugar 80-130 - after meal sugar <180 - HbA1C <7%  2. Engage in ?150 min moderate exercise per week  3. Make sure you have ?8h of sleep every night as this helps both blood sugars and your weight.  4. Always keep a sugar log (not only record in your meter) and bring it to all appointments with [place].  5. "15-15 rule" for hypoglycemia: if sugars are low, take 15 g of carbs** ("fast sugar" - e.g. 4 glucose tablets, 4 oz orange juice), wait 15 min, then check sugars again. If still <80, repeat. Continue  until your sugars >80, then eat a normal meal.   6. Teach family members and coworkers to inject glucagon. Have a glucagon set at home and one at work. They should call 911 after using the set.  7. Check sugar before driving. If <100, correct, and only start driving if sugars rise ?546. Check sugar every hour when on a long drive.  8. Check sugar before exercising. If <100, correct, and only start exercising if sugars rise ?100. Check sugar every hour when on a long exercise routine and 1h after you finished exercising.   If >250, check urine for ketones. If you have moderate-large ketones in urine, do not start exercise. Hydrate yourself with clear liquids and correct the high sugar. Recheck sugars and ketones before attempting to exercise.  Be aware that you might need less insulin when exercising.  *intense, short, exercise bursts can increase your sugars, but  *less intense, longer (>1h), exercise routines can decrease your sugars.    9. Make sure you have a MedAlert bracelet or pendant mentioning "Type I Diabetes Mellitus". If you have a prior episode of severe hypoglycemia or hypoglycemia unawareness, it should also mention this.  10. Please do not walk barefoot. Inspect your feet for sores/cuts and let us know if you have them.  **E.g. of "fast carbs": ? first choice (15 g):  1 tube glucose gel, GlucoPouch 15, 2 oz glucose liquid ? second choice (15-16 g):  3 or 4 glucose tablets (best taken  with water), 15 Dextrose Bits chewable ? third choice (15-20 g):   cup fruit juice,  cup regular soda, 1 cup skim milk,  1 cup sports drink ? fourth choice (15-20 g):  1 small tube [place] gel (not frosting), 2 tbsp raisins, 1 tbsp table sugar,  candy, jelly beans, gum drops - check package for carb amount   (adapted from: Juluis Rainier. "Insulin therapy and hypoglycemia" Endocrinol Metab Clin N Am 2012, 41: 57-87)  Sick Day Rules for Diabetes  Think S-K-I-L-L:  Sugars:  - if glucose >200, check every 3h and drink sugar free liquids  - if glucose <200, drink carb-containing liquids and recheck 30 min later  - if glucose high, correct with insulin  - if sugars <60, initiate hypoglycemia management (take 15 g of fast carbs and check sugars in 15 min  - repeat until sugars remain >100).  Ketones:  When to check ketones?  When glucose >  300 x2 if on insulin injections When nausea, vomiting, diarrhea, abdominal pain, headache, fever - even if glucose is normal or low - because in this case, you need both glucose and insulin.    - if you have ketone strips for blood >> if ketones are more or equal than 0.6, need to increase insulin - if you have ketone strips for urine >> if ketones are more or equal than "small", need to increase insulin  Insulin: Never skip long acting insulin, even if not eating!   Urine ketones Blood ketones Extra insulin?  no <0.6 no  small 0.6-1.5 Increase dose by 5%  moderate 1.5-3  Increase dose by 10%  large >3 Increase dose by at least 20%   Liquids: - if glucose >200, check every 3h and drink sugar free liquids  - if glucose <200, small sips of carb-containing liquids (e.g. Ginger ale, Gatorade, juice, etc.)  Let us know!   Call us if: Go to ED if: Call your primary care doctor if:  Sugars >300 for >8h Severe abdominal pain Fever >100F for 24h  Moderate to large  urine ketones or blood ketones >1.5 Difficulty breathing Other chronic diseases flaring up  Vomiting and unable to keep liquids down Signs of dehydration

## 2020-08-24 ENCOUNTER — Other Ambulatory Visit: Payer: Self-pay | Admitting: Internal Medicine

## 2020-08-24 ENCOUNTER — Encounter: Payer: Self-pay | Admitting: Internal Medicine

## 2020-08-24 MED ORDER — INSULIN PEN NEEDLE 32G X 4 MM MISC: 3 refills | Status: AC

## 2020-09-07 NOTE — Telephone Encounter (Signed)
Outbound call to patient requesting call back to schedule a virtual or phone appointment to discuss questions and concerns.

## 2020-09-12 ENCOUNTER — Other Ambulatory Visit: Payer: Self-pay

## 2020-09-12 ENCOUNTER — Encounter: Payer: Self-pay | Admitting: Internal Medicine

## 2020-09-12 ENCOUNTER — Ambulatory Visit (INDEPENDENT_AMBULATORY_CARE_PROVIDER_SITE_OTHER): Payer: Medicaid Other | Admitting: Internal Medicine

## 2020-09-12 VITALS — BP 140/90 | HR 93 | Ht 68.0 in | Wt 172.8 lb

## 2020-09-12 DIAGNOSIS — Z794 Long term (current) use of insulin: Secondary | ICD-10-CM

## 2020-09-12 DIAGNOSIS — E1165 Type 2 diabetes mellitus with hyperglycemia: Secondary | ICD-10-CM

## 2020-09-12 NOTE — Progress Notes (Signed)
Patient ID: Jeffree Cazeau, male   DOB: [birth year], 29 y.o.   MRN: [id number]  This visit occurred during the SARS-CoV-2 public health emergency.  Safety protocols were in place, including screening questions prior to the visit, additional usage of staff PPE, and extensive cleaning of exam room while observing appropriate contact time as indicated for disinfecting solutions.   HPI: Clare Casto is a 29 y.o.-year-old male, initially referred by Dr. Davina Poke, returning for follow-up for DM1, diagnosed in 2001 (7th grade - Glu >900 in [place]), uncontrolled, with PN.  He previously saw endocrinology but not since he was 29 years old. Last visit 3 weeks ago. He moved to [place] in 2017.  He does not have insurance yet but will have 1 starting next month.  He was in the emergency with hyperglycemia in 01/2020 and 05/2020.  I saw him 1 month ago and at that time we change his regimen from premixed insulin to basal/bolus insulin regimen.  Reviewed HbA1c levels: Lab Results  Component Value Date   HGBA1C 11.0 (H) 07/22/2020   At last visit he was on: - ReliON NPH/Regular 70/30 vial (right now, using his mother's) 40 units twice a day right before breakfast and dinner  I suggested: - Tresiba 36 units daily >> he was taking it every 40 hours!!! -Only recently changed to daily after he started to feel poorly. - Lyumjev  7-8 >> 6 units before a meal if plan to be more active after the meal 10-14 >> 12 units before regular meals  Meter: Freestyle Lite  Pt checks his sugars 4 times a day: - am: 80-130 >> 115-130 - 2h after b'fast: n/c - before lunch: 190-200s >> 160-170 - 2h after lunch: n/c - before dinner: 30s-60s if skating, or gym, OTW 80s-200 >> snack/gatorade: 205-220 - 2h after dinner: n/c - bedtime: 200s-300 >> 115-140 - nighttime: 30-200s >> n/c Lowest sugar was 30s; he has hypoglycemia awareness in the 30s occasionally. At last visit I sent prescription for glucagon to his pharmacy.   No previous hypoglycemia admission. Highest sugar was HI.  No previous DKA admissions.    Pt's meals are: - Breakfast: oatmeal + bagel - Lunch: homecooked: chilly with potatoes; egg and cheese on a bagel; beef patty - Dinner: homecooked: mac and cheese, chicken, fish - all baked - Snacks: candy Stopped Crystal Light He exercises every other day: Gym, skating. He is self-employed.  -No CKD, last BUN/creatinine:  Lab Results  Component Value Date   BUN 14 06/20/2020   BUN 14 04/08/2020   CREATININE 1.03 06/20/2020   CREATININE 0.92 04/08/2020   -+ HL; last set of lipids: No results found for: CHOL, HDL, LDLCALC, LDLDIRECT, TRIG, CHOLHDL   - last eye exam was in 2020:?  DR, but was told that there was "a problem" with R eye (will send me the records).   -+ Numbness and tingling in his feet.  He had 5 teeth extracted recently.  He cannot afford implants right now.  No history of hypothyroidism.  No TSH available for review: No results found for: TSH  Pt has FH of DM in mother, MGF, MGM.  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    ROS: Constitutional: no weight gain/no weight loss, + fatigue, + subjective hyperthermia, no subjective hypothermia, + poor sleep Eyes: no blurry vision, no xerophthalmia ENT: no sore throat, no nodules palpated in neck, no dysphagia, no odynophagia, no hoarseness Cardiovascular: no CP/no SOB/no palpitations/no  leg swelling Respiratory: no cough/no SOB/no wheezing Gastrointestinal: no N/no V/no D/no C/no acid reflux Musculoskeletal: + Muscle and joint aches Skin: no rashes, no hair loss Neurological: no tremors/+ numbness and tingling/no dizziness, + headaches  I reviewed pt's medications, allergies, PMH, social hx, family hx, and changes were documented in the history of present illness. Otherwise, unchanged from my initial visit note.  Past Medical History:  Diagnosis Date  . Diabetes mellitus without complication  [facility])    Past Surgical History:  Procedure Laterality Date  . HAND SURGERY     Social History   Socioeconomic History  . Marital status: Single    Spouse name: Not on file  . Number of children: 2  . Years of education: Not on file  . Highest education level: Not on file  Occupational History  . Occupation: photography  Tobacco Use  . Smoking status: Never Smoker  . Smokeless tobacco: Never Used  Vaping Use  . Vaping Use: Never used  Substance and Sexual Activity  . Alcohol use: Never  . Drug use: No  . Sexual activity: Not on file  Other Topics Concern  . Not on file  Social History Narrative  . Not on file   Social Determinants of Health   Financial Resource Strain:   . Difficulty of Paying Living Expenses: Not on file  Food Insecurity:   . Worried About Charity fundraiser in the Last Year: Not on file  . Ran Out of Food in the Last Year: Not on file  Transportation Needs:   . Lack of Transportation (Medical): Not on file  . Lack of Transportation (Non-Medical): Not on file  Physical Activity:   . Days of Exercise per Week: Not on file  . Minutes of Exercise per Session: Not on file  Stress:   . Feeling of Stress : Not on file  Social Connections:   . Frequency of Communication with Friends and Family: Not on file  . Frequency of Social Gatherings with Friends and Family: Not on file  . Attends Religious Services: Not on file  . Active Member of Clubs or Organizations: Not on file  . Attends Archivist Meetings: Not on file  . Marital Status: Not on file  Intimate Partner Violence:   . Fear of Current or Ex-Partner: Not on file  . Emotionally Abused: Not on file  . Physically Abused: Not on file  . Sexually Abused: Not on file   Current Outpatient Medications on File Prior to Visit  Medication Sig Dispense Refill  . amoxicillin (AMOXIL) 500 MG capsule Take 1 capsule (500 mg total) by mouth 3 (three) times daily. (Patient not taking: Reported on  08/23/2020) 21 capsule 0  . clindamycin (CLEOCIN) 150 MG capsule Take 2 capsules (300 mg total) by mouth every 6 (six) hours. (Patient not taking: Reported on 08/23/2020) 42 capsule 0  . Glucagon 3 MG/DOSE POWD Place 3 mg into the nose once as needed for up to 1 dose. 1 each 11  . Glucagon, rDNA, (GLUCAGON EMERGENCY) 1 MG KIT Please inject im as needed for hypoglycemia 1 kit 11  . HYDROcodone-acetaminophen (NORCO) 5-325 MG tablet Take 1-2 tablets by mouth every 4 (four) hours as needed. (Patient not taking: Reported on 08/23/2020) 12 tablet 0  . ibuprofen (ADVIL,MOTRIN) 800 MG tablet Take 1 tablet (800 mg total) by mouth every 6 (six) hours as needed. (Patient not taking: Reported on 08/23/2020) 30 tablet 0  . insulin degludec (TRESIBA FLEXTOUCH) 100  UNIT/ML FlexTouch Pen Use as directed 100 mL 0  . Insulin Lispro-aabc, 1 U Dial, (LYUMJEV KWIKPEN) 100 UNIT/ML SOPN Use as directed 100 mL 0  . Insulin Pen Needle 32G X 4 MM MISC Use up to 5x a day as advised 400 each 3  . lidocaine (XYLOCAINE) 2 % solution Use as directed 15 mLs in the mouth or throat as needed for mouth pain. Or can apply topically with q-tip/cotton ball. (Patient not taking: Reported on 08/23/2020) 150 mL 0  . magic mouthwash w/lidocaine SOLN Take 10 mLs by mouth 3 (three) times daily. (Patient not taking: Reported on 08/23/2020) 50 mL 1  . meloxicam (MOBIC) 15 MG tablet Take 1 tablet (15 mg total) by mouth daily. (Patient not taking: Reported on 08/23/2020) 30 tablet 0  . naproxen (NAPROSYN) 500 MG tablet Take 1 tablet (500 mg total) by mouth 2 (two) times daily with a meal. (Patient not taking: Reported on 08/23/2020) 30 tablet 0  . ondansetron (ZOFRAN ODT) 4 MG disintegrating tablet Take 1 tablet (4 mg total) by mouth every 8 (eight) hours as needed for nausea or vomiting. (Patient not taking: Reported on 08/23/2020) 10 tablet 0  . penicillin v potassium (VEETID) 500 MG tablet Take 1 tablet (500 mg total) by mouth 4 (four) times daily.  (Patient not taking: Reported on 08/23/2020) 40 tablet 0  . traMADol (ULTRAM) 50 MG tablet Take 1 tablet (50 mg total) by mouth every 6 (six) hours as needed. (Patient not taking: Reported on 08/23/2020) 15 tablet 0   No current facility-administered medications on file prior to visit.   No Known Allergies Family History  Problem Relation Age of Onset  . Heart disease Paternal Grandfather     PE: BP 140/90   Pulse 93   Ht _0  (1.727 m)   Wt 172 lb 12.8 oz (78.4 kg)   SpO2 98%   BMI 26.27 kg/m  Wt Readings from Last 3 Encounters:  09/12/20 172 lb 12.8 oz (78.4 kg)  08/23/20 174 lb 6.4 oz (79.1 kg)  08/15/20 170 lb (77.1 kg)   Constitutional: normal weight, in NAD Eyes: PERRLA, EOMI, no exophthalmos ENT: moist mucous membranes, no thyromegaly, no cervical lymphadenopathy Cardiovascular: RRR, No MRG Respiratory: CTA B Gastrointestinal: abdomen soft, NT, ND, BS+ Musculoskeletal: no deformities, strength intact in all 4 Skin: moist, warm, no rashes Neurological: no tremor with outstretched hands, DTR normal in all 4  ASSESSMENT: 1. DM1, uncontrolled, with with complications -Peripheral neuropathy -Possible DR-we will need records  -Lack of insurance  PLAN:  1. Patient with longstanding, uncontrolled, type 1 diabetes.  He is self-employed and already applied for an insurance and he was telling me that this would start in December.  She was using the 70/30 insulin regimen from [facility] and he was taking large doses of insulin, take her right before meals and at need for correction.  We discussed at last visit that this was not an appropriate regimen for him since this is inflexible and also the fact that it needed to be taken 30 minutes before meals.  I explained that this regimen was not really designed to correct his blood sugars more to take higher doses of mealtime insulin.  At last visit, since he had only approximately 1 month until he could get the new insurance, we designed  a basal-bolus insulin regimen and I gave him samples Tyler Aas and Lyumjev) to last him until his new insurance kicks in.  He was not counting carbs and  we discussed about the referral to nutrition at our next visit.  I did advise him to stop bagels and also candy and other sweets. -He was interested in a CGM and also an insulin pump.  We discussed that I need to document compliance with coming for the visits and also with the recommended regimen before applying for a pump.   -However, he sent me several messages since last visit as he was confused about the regimen and we brought him back today to answer his questions and to give him more samples -At this visit, he tells me that he switched to the recommended regimen that he was actually taking [place] every 40 hours, after our discussion, as he misunderstood dosing instructions it did not look at the after visit summary.  He realized few days ago that he actually had to take [place] daily.  He started to feel better after starting to take it every day.  He also had problems with Lyumjev, initially sugars were very high (probably due to taking [place] every other day) and he started to inject more Lyumjev and limit his sweets along with spending more time at the gym, after which, he was dropping his sugars more.  However, this resolved after he started to take his [place] daily. -We reviewed his blood sugars from home and as of now, they are at goal in the morning and at bedtime but they increase throughout the day, a sign of not enough rapid acting insulin.  We did discuss that his usual dose of 6 units before meals is probably not enough and I advised him to increase this to 8 units for meals if he plans to be active after the meal but he is higher doses for other regular meals and only occasionally using 14 units if he goes out to eat or has a holiday meal.  I also underlined the fact that he absolutely needs to take [place] every day. -Given samples for  [place] since his insurance actually kicks in after the first of the year - I suggested to: Patient Instructions  Please continue: - Tresiba 36 units every day - Lyumjev  6-8 units before a meal if plan to be more active after the meal 10-14 units before regular meals   Please return in 1 month.  - advised to check sugars at different times of the day - 4x a day, rotating check times - advised for yearly eye exams >> he is UTD - return to clinic in 1 month  - Total time spent for the visit: 25 min, in obtaining medical information from the patient, reviewing his insulin doses, reviewing his  previous labs, his symptoms, counseling him about his condition and especially how to take his insulin correctly (please see the discussed topics above); he had a number of questions which I addressed.  Philemon Kingdom, MD PhD [facility] Endocrinology

## 2020-09-12 NOTE — Patient Instructions (Addendum)
Please continue: - Tresiba 36 units every day - Lyumjev  6-8 units before a meal if plan to be more active after the meal 10-14 units before regular meals   Please return in 1 month.

## 2020-09-22 NOTE — Telephone Encounter (Signed)
Patient was given samples today.  We also gave him paperwork to fill out to send the patient assistance. He cannot take oral medications because he has type I, rather than type 2 diabetes.

## 2020-10-10 ENCOUNTER — Ambulatory Visit: Payer: Medicaid Other | Admitting: Internal Medicine

## 2020-10-10 NOTE — Progress Notes (Deleted)
Patient ID: Alan Burke, male   DOB: 06/06/1991, 29 y.o.   MRN: [id number]  This visit occurred during the SARS-CoV-2 public health emergency.  Safety protocols were in place, including screening questions prior to the visit, additional usage of staff PPE, and extensive cleaning of exam room while observing appropriate contact time as indicated for disinfecting solutions.   HPI: Alan Burke is a 29 y.o.-year-old male, initially referred by Dr. Davina Poke, returning for follow-up for DM1, diagnosed in 2001 (7th grade - Glu >900 in [place]), uncontrolled, with PN.  He previously saw endocrinology but not since he was 28 years old. Last visit 3 weeks ago. He moved to [place] in 2017.  He now has Medicaid.  Reviewed his HbA1c levels: Lab Results  Component Value Date   HGBA1C 11.0 (H) 07/22/2020   Previously on: - ReliON NPH/Regular 70/30 vial (right now, using his mother's) 40 units twice a day right before breakfast and dinner  Currently on: - Tresiba 36 units daily >> he was taking it every 40 hours!!! >> now daily - Lyumjev  7-8 >> 6 >> 6-8 units before a meal if plan to be more active after the meal 10-14 >> 12 >> 10-14 units before regular meals  Meter: Freestyle Lite  Pt checks his sugars 4 times a day: - am: 80-130 >> 115-130 - 2h after b'fast: n/c - before lunch: 190-200s >> 160-170 - 2h after lunch: n/c - before dinner: 30s-60s if skating, or gym, OTW 80s-200 >> snack/gatorade: 205-220 - 2h after dinner: n/c - bedtime: 200s-300 >> 115-140 - nighttime: 30-200s >> n/c Lowest sugar was 30s >> ***; he has hypoglycemia awareness in the 30s occasionally At last visit I sent prescription for glucagon to his pharmacy.  No previous hypoglycemia admission. Highest sugar was  HI >> ***.  No previous DKA admissions. He was in the emergency with hyperglycemia in 01/2020 and 05/2020.  Pt's meals are: - Breakfast: oatmeal + bagel - Lunch: homecooked: chilly with potatoes; egg and cheese  on a bagel; beef patty - Dinner: homecooked: mac and cheese, chicken, fish - all baked - Snacks: candy Stopped Crystal Light He exercises every other day: Gym, skating. He is self-employed.  -No CKD, last BUN/creatinine:  Lab Results  Component Value Date   BUN 14 06/20/2020   BUN 14 04/08/2020   CREATININE 1.03 06/20/2020   CREATININE 0.92 04/08/2020   -+ HL; last set of lipids: No results found for: CHOL, HDL, LDLCALC, LDLDIRECT, TRIG, CHOLHDL   - last eye exam was in 2020:?  DR, but was told that there was "a problem" with R eye -he will send me the records  -He has numbness and tingling in his feet.  He had 5 teeth extracted recently.  He cannot afford implants right now.  No history of hypothyroidism. No results found for: TSH  Pt has FH of DM in mother, MGF, MGM.  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    ROS: Constitutional: no weight gain/no weight loss, + fatigue, + subjective hyperthermia, no subjective hypothermia Eyes: no blurry vision, no xerophthalmia ENT: no sore throat, no nodules palpated in neck, no dysphagia, no odynophagia, no hoarseness Cardiovascular: no CP/no SOB/no palpitations/no leg swelling Respiratory: no cough/no SOB/no wheezing Gastrointestinal: no N/no V/no D/no C/no acid reflux Musculoskeletal: + muscle aches/no joint aches Skin: no rashes, no hair loss Neurological: no tremors/+ numbness/no tingling/no dizziness  I reviewed pt's medications, allergies, PMH, social hx, family hx,  and changes were documented in the history of present illness. Otherwise, unchanged from my initial visit note.  Past Medical History:  Diagnosis Date  . Diabetes mellitus without complication [facility])    Past Surgical History:  Procedure Laterality Date  . HAND SURGERY     Social History   Socioeconomic History  . Marital status: Single    Spouse name: Not on file  . Number of children: 2  . Years of education: Not on file  .  Highest education level: Not on file  Occupational History  . Occupation: photography  Tobacco Use  . Smoking status: Never Smoker  . Smokeless tobacco: Never Used  Vaping Use  . Vaping Use: Never used  Substance and Sexual Activity  . Alcohol use: Never  . Drug use: No  . Sexual activity: Not on file  Other Topics Concern  . Not on file  Social History Narrative  . Not on file   Social Determinants of Health   Financial Resource Strain: Not on file  Food Insecurity: Not on file  Transportation Needs: Not on file  Physical Activity: Not on file  Stress: Not on file  Social Connections: Not on file  Intimate Partner Violence: Not on file   Current Outpatient Medications on File Prior to Visit  Medication Sig Dispense Refill  . amoxicillin (AMOXIL) 500 MG capsule Take 1 capsule (500 mg total) by mouth 3 (three) times daily. 21 capsule 0  . clindamycin (CLEOCIN) 150 MG capsule Take 2 capsules (300 mg total) by mouth every 6 (six) hours. 42 capsule 0  . Glucagon 3 MG/DOSE POWD Place 3 mg into the nose once as needed for up to 1 dose. 1 each 11  . Glucagon, rDNA, (GLUCAGON EMERGENCY) 1 MG KIT Please inject im as needed for hypoglycemia 1 kit 11  . HYDROcodone-acetaminophen (NORCO) 5-325 MG tablet Take 1-2 tablets by mouth every 4 (four) hours as needed. 12 tablet 0  . ibuprofen (ADVIL,MOTRIN) 800 MG tablet Take 1 tablet (800 mg total) by mouth every 6 (six) hours as needed. 30 tablet 0  . insulin degludec (TRESIBA FLEXTOUCH) 100 UNIT/ML FlexTouch Pen Use as directed 100 mL 0  . Insulin Lispro-aabc, 1 U Dial, (LYUMJEV KWIKPEN) 100 UNIT/ML SOPN Use as directed 100 mL 0  . Insulin Pen Needle 32G X 4 MM MISC Use up to 5x a day as advised 400 each 3  . lidocaine (XYLOCAINE) 2 % solution Use as directed 15 mLs in the mouth or throat as needed for mouth pain. Or can apply topically with q-tip/cotton ball. 150 mL 0  . magic mouthwash w/lidocaine SOLN Take 10 mLs by mouth 3 (three) times  daily. 50 mL 1   No current facility-administered medications on file prior to visit.   No Known Allergies Family History  Problem Relation Age of Onset  . Heart disease Paternal Grandfather     PE: There were no vitals taken for this visit. Wt Readings from Last 3 Encounters:  09/12/20 172 lb 12.8 oz (78.4 kg)  08/23/20 174 lb 6.4 oz (79.1 kg)  08/15/20 170 lb (77.1 kg)   Constitutional: normal weight, in NAD Eyes: PERRLA, EOMI, no exophthalmos ENT: moist mucous membranes, no thyromegaly, no cervical lymphadenopathy Cardiovascular: RRR, No MRG Respiratory: CTA B Gastrointestinal: abdomen soft, NT, ND, BS+ Musculoskeletal: no deformities, strength intact in all 4 Skin: moist, warm, no rashes Neurological: no tremor with outstretched hands, DTR normal in all 4  ASSESSMENT: 1. DM1, uncontrolled, with with  complications -Peripheral neuropathy -Possible DR-we will need records  -Lack of insurance  PLAN:  1. Patient with   longstanding, uncontrolled, type 1 diabetes.  He is self-employed and already applied for an insurance and he was telling me that this would start in December.  She was using the 70/30 insulin regimen from [facility] and he was taking large doses of insulin, take her right before meals and at need for correction.  We discussed at last visit that this was not an appropriate regimen for him since this is inflexible and also the fact that it needed to be taken 30 minutes before meals.  I explained that this regimen was not really designed to correct his blood sugars more to take higher doses of mealtime insulin.  At last visit, since he had only approximately 1 month until he could get the new insurance, we designed a basal-bolus insulin regimen and I gave him samples Tyler Aas and Lyumjev) to last him until his new insurance kicks in.  He was not counting carbs and we discussed about the referral to nutrition at our next visit.  I did advise him to stop bagels and also  candy and other sweets. -He was interested in a CGM and also an insulin pump.  We discussed that I need to document compliance with coming for the visits and also with the recommended regimen before applying for a pump.   -However, he sent me several messages since last visit as he was confused about the regimen and we brought him back today to answer his questions and to give him more samples -At this visit, he tells me that he switched to the recommended regimen that he was actually taking [place] every 40 hours, after our discussion, as he misunderstood dosing instructions it did not look at the after visit summary.  He realized few days ago that he actually had to take [place] daily.  He started to feel better after starting to take it every day.  He also had problems with Lyumjev, initially sugars were very high (probably due to taking [place] every other day) and he started to inject more Lyumjev and limit his sweets along with spending more time at the gym, after which, he was dropping his sugars more.  However, this resolved after he started to take his [place] daily. -We reviewed his blood sugars from home and as of now, they are at goal in the morning and at bedtime but they increase throughout the day, a sign of not enough rapid acting insulin.  We did discuss that his usual dose of 6 units before meals is probably not enough and I advised him to increase this to 8 units for meals if he plans to be active after the meal but he is higher doses for other regular meals and only occasionally using 14 units if he goes out to eat or has a holiday meal.  I also underlined the fact that he absolutely needs to take [place] every day. -Given samples for [place] since his insurance actually kicks in after the first of the year  - I suggested to: Patient Instructions  Please continue: - Tresiba 36 units every day - Lyumjev  6-8 units before a meal if plan to be more active after the  meal 10-14 units before regular meals   Please return in 3 months.  - we checked his HbA1c: 7%  - advised to check sugars at different times of the day - 1x a day,  rotating check times - advised for yearly eye exams >> he is UTD - return to clinic in 3 months   Philemon Kingdom, MD PhD [facility] Endocrinology

## 2020-10-12 NOTE — Telephone Encounter (Signed)
Patient called asking what to do about patient assistance ([employer] and [employer]) - he said he does not have a job and has no proof of Employment to send to the assistance companies. He asked what he needed to do at this point and I advised that he give them a call (I provided the phone numbers for both of them) and ask them what he can/should do. I informed him that we have not sent them any assistance paperwork and that we are holding it at the front desk if he would like to come pick it up or bring any information supporting financials for [place] to send them. FYI

## 2020-11-01 ENCOUNTER — Telehealth: Payer: Self-pay | Admitting: General Practice

## 2020-11-01 NOTE — Telephone Encounter (Signed)
Called patient and LVM advising patient that his appointment for tomorrow with Dr. Alvis Lemmings had been cancelled due to her being out of the office in the afternoon. If patient calls back to reschedule he can be schedule at [facility] with the mobile medicine unit tomorrow to be seen. Advised patient to call [phone] to schedule.

## 2020-11-02 ENCOUNTER — Ambulatory Visit: Payer: Medicaid Other | Admitting: Family Medicine

## 2020-11-04 ENCOUNTER — Encounter (HOSPITAL_COMMUNITY): Payer: Self-pay | Admitting: Emergency Medicine

## 2020-11-04 ENCOUNTER — Emergency Department (HOSPITAL_COMMUNITY)
Admission: EM | Admit: 2020-11-04 | Discharge: 2020-11-05 | Disposition: A | Discharge status: Home / Self Care | Payer: No Typology Code available for payment source | Attending: Emergency Medicine | Admitting: Emergency Medicine

## 2020-11-04 ENCOUNTER — Other Ambulatory Visit: Payer: Self-pay

## 2020-11-04 DIAGNOSIS — Y92410 Unspecified street and highway as the place of occurrence of the external cause: Secondary | ICD-10-CM | POA: Insufficient documentation

## 2020-11-04 DIAGNOSIS — S199XXA Unspecified injury of neck, initial encounter: Secondary | ICD-10-CM | POA: Diagnosis not present

## 2020-11-04 DIAGNOSIS — E119 Type 2 diabetes mellitus without complications: Secondary | ICD-10-CM | POA: Diagnosis not present

## 2020-11-04 DIAGNOSIS — T07XXXA Unspecified multiple injuries, initial encounter: Secondary | ICD-10-CM

## 2020-11-04 DIAGNOSIS — S3991XA Unspecified injury of abdomen, initial encounter: Secondary | ICD-10-CM | POA: Insufficient documentation

## 2020-11-04 DIAGNOSIS — S0093XA Contusion of unspecified part of head, initial encounter: Secondary | ICD-10-CM | POA: Insufficient documentation

## 2020-11-04 DIAGNOSIS — S299XXA Unspecified injury of thorax, initial encounter: Secondary | ICD-10-CM | POA: Insufficient documentation

## 2020-11-04 DIAGNOSIS — Z794 Long term (current) use of insulin: Secondary | ICD-10-CM | POA: Diagnosis not present

## 2020-11-04 DIAGNOSIS — S0990XA Unspecified injury of head, initial encounter: Secondary | ICD-10-CM | POA: Diagnosis present
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
## 2020-11-05 ENCOUNTER — Emergency Department (HOSPITAL_COMMUNITY): Payer: No Typology Code available for payment source

## 2020-11-05 LAB — COMPREHENSIVE METABOLIC PANEL
ALT: 23 U/L (ref 0–44)
AST: 33 U/L (ref 15–41)
Albumin: 3.9 g/dL (ref 3.5–5.0)
Alkaline Phosphatase: 66 U/L (ref 38–126)
Anion gap: 11 (ref 5–15)
BUN: 10 mg/dL (ref 6–20)
CO2: 27 mmol/L (ref 22–32)
Calcium: 9.6 mg/dL (ref 8.9–10.3)
Chloride: 96 mmol/L — ABNORMAL LOW (ref 98–111)
Creatinine, Ser: 0.94 mg/dL (ref 0.61–1.24)
GFR, Estimated: >60 mL/min (ref >60)
Glucose, Bld: 207 mg/dL — ABNORMAL HIGH (ref 70–99)
Potassium: 4.3 mmol/L (ref 3.5–5.1)
Sodium: 134 mmol/L — ABNORMAL LOW (ref 135–145)
Total Bilirubin: 0.9 mg/dL (ref 0.3–1.2)
Total Protein: 7.5 g/dL (ref 6.5–8.1)

## 2020-11-05 LAB — CBC
HCT: 45.5 % (ref 39.0–52.0)
Hemoglobin: 15 g/dL (ref 13.0–17.0)
MCH: 28.1 pg (ref 26.0–34.0)
MCHC: 33 g/dL (ref 30.0–36.0)
MCV: 85.4 fL (ref 80.0–100.0)
Platelets: 203 10*3/uL (ref 150–400)
RBC: 5.33 MIL/uL (ref 4.22–5.81)
RDW: 13 % (ref 11.5–15.5)
WBC: 8.4 10*3/uL (ref 4.0–10.5)
nRBC: 0 % (ref 0.0–0.2)

## 2020-11-05 MED ORDER — IBUPROFEN 800 MG PO TABS: 800.0000 mg | ORAL_TABLET | Freq: Four times a day (QID) | ORAL | 0 refills | Status: DC | PRN

## 2020-11-05 MED ORDER — SODIUM CHLORIDE 0.9 % IV BOLUS
1000.0000 mL | Freq: Once | INTRAVENOUS | Status: AC
  Administered 2020-11-05: 1000 mL via INTRAVENOUS

## 2020-11-05 MED ORDER — IOHEXOL 300 MG/ML  SOLN
100.0000 mL | Freq: Once | INTRAMUSCULAR | Status: AC | PRN
  Administered 2020-11-05: 100 mL via INTRAVENOUS

## 2020-11-05 MED ORDER — CYCLOBENZAPRINE HCL 10 MG PO TABS: 10.0000 mg | ORAL_TABLET | Freq: Three times a day (TID) | ORAL | 0 refills | Status: DC | PRN
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
## 2020-11-05 NOTE — ED Notes (Signed)
Pt to CT via stretcher

## 2020-11-15 ENCOUNTER — Other Ambulatory Visit: Payer: Self-pay

## 2020-11-15 ENCOUNTER — Emergency Department (HOSPITAL_COMMUNITY): Payer: Self-pay

## 2020-11-15 ENCOUNTER — Emergency Department (HOSPITAL_COMMUNITY)
Admission: EM | Admit: 2020-11-15 | Discharge: 2020-11-15 | Disposition: A | Discharge status: Home / Self Care | Payer: Self-pay | Attending: Emergency Medicine | Admitting: Emergency Medicine

## 2020-11-15 DIAGNOSIS — R569 Unspecified convulsions: Secondary | ICD-10-CM | POA: Insufficient documentation

## 2020-11-15 DIAGNOSIS — E1069 Type 1 diabetes mellitus with other specified complication: Secondary | ICD-10-CM

## 2020-11-15 DIAGNOSIS — E10649 Type 1 diabetes mellitus with hypoglycemia without coma: Secondary | ICD-10-CM | POA: Insufficient documentation

## 2020-11-15 DIAGNOSIS — Z794 Long term (current) use of insulin: Secondary | ICD-10-CM | POA: Insufficient documentation

## 2020-11-15 LAB — BASIC METABOLIC PANEL
Anion gap: 11 (ref 5–15)
BUN: 5 mg/dL — ABNORMAL LOW (ref 6–20)
CO2: 19 mmol/L — ABNORMAL LOW (ref 22–32)
Calcium: 8.1 mg/dL — ABNORMAL LOW (ref 8.9–10.3)
Chloride: 108 mmol/L (ref 98–111)
Creatinine, Ser: 0.88 mg/dL (ref 0.61–1.24)
GFR, Estimated: >60 mL/min (ref >60)
Glucose, Bld: 154 mg/dL — ABNORMAL HIGH (ref 70–99)
Potassium: 3.3 mmol/L — ABNORMAL LOW (ref 3.5–5.1)
Sodium: 138 mmol/L (ref 135–145)

## 2020-11-15 LAB — CBC WITH DIFFERENTIAL/PLATELET
Abs Immature Granulocytes: 0.06 10*3/uL (ref 0.00–0.07)
Basophils Absolute: 0 10*3/uL (ref 0.0–0.1)
Basophils Relative: 0 %
Eosinophils Absolute: 0 10*3/uL (ref 0.0–0.5)
Eosinophils Relative: 0 %
HCT: 37.9 % — ABNORMAL LOW (ref 39.0–52.0)
Hemoglobin: 12.4 g/dL — ABNORMAL LOW (ref 13.0–17.0)
Immature Granulocytes: 1 %
Lymphocytes Relative: 7 %
Lymphs Abs: 0.8 10*3/uL (ref 0.7–4.0)
MCH: 28.8 pg (ref 26.0–34.0)
MCHC: 32.7 g/dL (ref 30.0–36.0)
MCV: 87.9 fL (ref 80.0–100.0)
Monocytes Absolute: 0.5 10*3/uL (ref 0.1–1.0)
Monocytes Relative: 5 %
Neutro Abs: 10 10*3/uL — ABNORMAL HIGH (ref 1.7–7.7)
Neutrophils Relative %: 87 %
Platelets: 141 10*3/uL — ABNORMAL LOW (ref 150–400)
RBC: 4.31 MIL/uL (ref 4.22–5.81)
RDW: 13 % (ref 11.5–15.5)
WBC: 11.4 10*3/uL — ABNORMAL HIGH (ref 4.0–10.5)
nRBC: 0 % (ref 0.0–0.2)

## 2020-11-15 LAB — CBG MONITORING, ED: Glucose-Capillary: 145 mg/dL — ABNORMAL HIGH (ref 70–99)

## 2020-11-15 LAB — RAPID URINE DRUG SCREEN, HOSP PERFORMED
Amphetamines: NOT DETECTED
Barbiturates: NOT DETECTED
Benzodiazepines: NOT DETECTED
Cocaine: NOT DETECTED
Opiates: NOT DETECTED
Tetrahydrocannabinol: NOT DETECTED

## 2020-11-15 MED ORDER — ONDANSETRON HCL 4 MG/2ML IJ SOLN
4.0000 mg | Freq: Once | INTRAMUSCULAR | Status: AC
  Administered 2020-11-15: 4 mg via INTRAVENOUS
  Filled 2020-11-15: qty 2

## 2020-11-15 NOTE — ED Provider Notes (Signed)
[facility] EMERGENCY DEPARTMENT Provider Note   CSN: [id number] Arrival date & time: 11/15/20  1033     History Chief Complaint  Patient presents with  . Seizures    Alan Burke is a 30 y.o. male.  Presents to ER with concern for seizure activity.  Mother reports that she witnessed the episode of patient having upper and lower extremity shaking.  Some tongue biting, no bladder or bowel incontinence.  They were concerned that his sugar may have been low and she tried giving him orange juice.  When EMS arrived, CBG was 70 and patient was postictal.  Patient states that he feels fine at present.  Denies any ongoing medical complaints.  States that he follows closely with endocrinology for insulin management of his type 1 diabetes.  Cannot remember if he checked his sugar before going to bed and does not member checking his sugar this morning.  Had not eaten anything this morning.  HPI     Past Medical History:  Diagnosis Date  . Diabetes mellitus without complication ([place])     There are no problems to display for this patient.   Past Surgical History:  Procedure Laterality Date  . HAND SURGERY         Family History  Problem Relation Age of Onset  . Heart disease Paternal Grandfather     Social History   Tobacco Use  . Smoking status: Never Smoker  . Smokeless tobacco: Never Used  Vaping Use  . Vaping Use: Never used  Substance Use Topics  . Alcohol use: Never  . Drug use: No    Home Medications Prior to Admission medications   Medication Sig Start Date End Date Taking? Authorizing Provider  amoxicillin (AMOXIL) 500 MG capsule Take 1 capsule (500 mg total) by mouth 3 (three) times daily. 10/03/17   Margarita Mail, PA-C  clindamycin (CLEOCIN) 150 MG capsule Take 2 capsules (300 mg total) by mouth every 6 (six) hours. 10/05/18   Kirichenko, Lahoma Rocker, PA-C  cyclobenzaprine (FLEXERIL) 10 MG tablet Take 1 tablet (10 mg total) by mouth 3 (three)  times daily as needed for muscle spasms. 11/05/20   Orpah Greek, MD  Glucagon 3 MG/DOSE POWD Place 3 mg into the nose once as needed for up to 1 dose. 08/23/20   Philemon Kingdom, MD  Glucagon, rDNA, (GLUCAGON EMERGENCY) 1 MG KIT Please inject im as needed for hypoglycemia 08/23/20   Philemon Kingdom, MD  HYDROcodone-acetaminophen (NORCO) 5-325 MG tablet Take 1-2 tablets by mouth every 4 (four) hours as needed. 08/25/16   Harris, Vernie Shanks, PA-C  ibuprofen (ADVIL) 800 MG tablet Take 1 tablet (800 mg total) by mouth every 6 (six) hours as needed for moderate pain. 11/05/20   Orpah Greek, MD  insulin degludec (TRESIBA FLEXTOUCH) 100 UNIT/ML FlexTouch Pen Use as directed 08/23/20   Philemon Kingdom, MD  Insulin Lispro-aabc, 1 U Dial, (LYUMJEV KWIKPEN) 100 UNIT/ML SOPN Use as directed 08/23/20   Philemon Kingdom, MD  Insulin Pen Needle 32G X 4 MM MISC Use up to 5x a day as advised 08/24/20   Philemon Kingdom, MD  lidocaine (XYLOCAINE) 2 % solution Use as directed 15 mLs in the mouth or throat as needed for mouth pain. Or can apply topically with q-tip/cotton ball. 09/16/18   Larene Pickett, PA-C  magic mouthwash w/lidocaine SOLN Take 10 mLs by mouth 3 (three) times daily. 12/09/18   Marney Setting, NP    Allergies    Patient  has no known allergies.  Review of Systems   Review of Systems  Constitutional: Negative for chills and fever.  HENT: Negative for ear pain and sore throat.   Eyes: Negative for pain and visual disturbance.  Respiratory: Negative for cough and shortness of breath.   Cardiovascular: Negative for chest pain and palpitations.  Gastrointestinal: Negative for abdominal pain and vomiting.  Genitourinary: Negative for dysuria and hematuria.  Musculoskeletal: Negative for arthralgias and back pain.  Skin: Negative for color change and rash.  Neurological: Positive for seizures. Negative for syncope.  All other systems reviewed and are negative.   Physical  Exam Updated Vital Signs BP 100/76 (BP Location: Right Arm)   Pulse 84   Temp 97.8 F (36.6 C) (Oral)   Resp 16   SpO2 100%   Physical Exam Vitals and nursing note reviewed.  Constitutional:      Appearance: He is well-developed and well-nourished.  HENT:     Head: Normocephalic and atraumatic.  Eyes:     Conjunctiva/sclera: Conjunctivae normal.  Cardiovascular:     Rate and Rhythm: Normal rate and regular rhythm.     Heart sounds: No murmur heard.   Pulmonary:     Effort: Pulmonary effort is normal. No respiratory distress.     Breath sounds: Normal breath sounds.  Abdominal:     Palpations: Abdomen is soft.     Tenderness: There is no abdominal tenderness.  Musculoskeletal:        General: No edema.     Cervical back: Neck supple.  Skin:    General: Skin is warm and dry.  Neurological:     General: No focal deficit present.     Mental Status: He is alert and oriented to person, place, and time.  Psychiatric:        Mood and Affect: Mood and affect normal.     ED Results / Procedures / Treatments   Labs (all labs ordered are listed, but only abnormal results are displayed) Labs Reviewed  CBC WITH DIFFERENTIAL/PLATELET - Abnormal; Notable for the following components:      Result Value   WBC 11.4 (*)    Hemoglobin 12.4 (*)    HCT 37.9 (*)    Platelets 141 (*)    Neutro Abs 10.0 (*)    All other components within normal limits  BASIC METABOLIC PANEL - Abnormal; Notable for the following components:   Potassium 3.3 (*)    CO2 19 (*)    Glucose, Bld 154 (*)    BUN 5 (*)    Calcium 8.1 (*)    All other components within normal limits  RAPID URINE DRUG SCREEN, HOSP PERFORMED  CBG MONITORING, ED    EKG EKG Interpretation  Date/Time:  Tuesday November 15 2020 10:37:02 EST Ventricular Rate:  86 PR Interval:    QRS Duration: 85 QT Interval:  374 QTC Calculation: 448 R Axis:   32 Text Interpretation: Sinus rhythm Borderline T wave abnormalities  Confirmed by Madalyn Rob [phone]) on 11/15/2020 10:59:30 AM   Radiology CT Head Wo Contrast  Result Date: 11/15/2020 CLINICAL DATA:  Seizure EXAM: CT HEAD WITHOUT CONTRAST TECHNIQUE: Contiguous axial images were obtained from the base of the skull through the vertex without intravenous contrast. COMPARISON:  None. FINDINGS: Brain: Normal anatomic configuration. No abnormal intra or extra-axial mass lesion or fluid collection. No abnormal mass effect or midline shift. No evidence of acute intracranial hemorrhage or infarct. Ventricular size is normal. Cerebellum unremarkable. Vascular: Unremarkable Skull:  Intact Sinuses/Orbits: Paranasal sinuses are clear. Orbits are unremarkable. Other: Mastoid air cells and middle ear cavities are clear. IMPRESSION: No acute intracranial abnormality.  Normal exam. Electronically Signed   By: Fidela Salisbury MD   On: 11/15/2020 12:18    Procedures Procedures   Medications Ordered in ED Medications  ondansetron [facility]) injection 4 mg (4 mg Intravenous Given 11/15/20 1152)    ED Course  I have reviewed the triage vital signs and the nursing notes.  Pertinent labs & imaging results that were available during my care of the patient were reviewed by me and considered in my medical decision making (see chart for details).    MDM Rules/Calculators/A&P                         29 year old male with type 1 diabetes presented to ER with concern for seizure episode as well as suspected hypoglycemic episode.  Based on history, seizure episode could have been due to hypoglycemia that had improved by the time EMS checked CBG.  Per review of chart, patient has not had neurology consultation or EEG.  CT head today was negative, basic labs were grossly stable.  Blood glucose in ER was stable.  On reassessment he was well-appearing with no ongoing symptoms.  Believe he is appropriate for discharge and outpatient management at this time.  Believe he would benefit from outpatient  neurology follow-up for further evaluation of these possible seizure episodes.  Additionally stressed importance of checking sugars frequently, eating small snacks throughout the day.  And stressed need for close follow-up with endocrinology to discuss insulin regimen.  After the discussed management above, the patient was determined to be safe for discharge.  The patient was in agreement with this plan and all questions regarding their care were answered.  ED return precautions were discussed and the patient will return to the ED with any significant worsening of condition.   Final Clinical Impression(s) / ED Diagnoses Final diagnoses:  Seizure ([place])  Type 1 diabetes mellitus with other specified complication ([place])    Rx / DC Orders ED Discharge Orders         Ordered    Ambulatory referral to Neurology       Comments: An appointment is requested in approximately: 1 week   11/15/20 1337           Lucrezia Starch, MD 11/16/20 [phone]

## 2020-11-15 NOTE — ED Notes (Signed)
Pt is sitting up in bed dry heaving and spitting up stomach bile. MD to place nausea meds. Will administer per [facility]. Pt's mother at bedside.

## 2020-11-15 NOTE — ED Triage Notes (Signed)
Pt BIBA from home after a witnessed tonic clonic seizure witnessed by the patient's mother. Per mom patient has seizures when his sugar is low. Pt was found by EMS to have a glucose of 70 on EMS arrival and post-ictal. Pt bit down on the tip of his tongue during the seizure. Glucagon given to patient in the field. Pt glucose 168 for EMS upon arrival to hospital. Pt currently is awake and alert answering all questions appropriately.

## 2020-11-15 NOTE — Discharge Instructions (Signed)
Recommend continuing your current diabetes regimen however please check your sugars regularly including at night before going to bed and in the morning when you wake up.  Recommend small snacks regularly throughout the day.  Strongly recommend follow-up with both your endocrinologist as well as a neurologist.

## 2021-01-07 ENCOUNTER — Other Ambulatory Visit: Payer: Self-pay

## 2021-01-07 ENCOUNTER — Emergency Department (HOSPITAL_COMMUNITY)
Admission: EM | Admit: 2021-01-07 | Discharge: 2021-01-07 | Disposition: A | Discharge status: Home / Self Care | Payer: Medicaid Other | Attending: Emergency Medicine | Admitting: Emergency Medicine

## 2021-01-07 ENCOUNTER — Encounter (HOSPITAL_COMMUNITY): Payer: Self-pay | Admitting: Emergency Medicine

## 2021-01-07 DIAGNOSIS — K047 Periapical abscess without sinus: Secondary | ICD-10-CM | POA: Insufficient documentation

## 2021-01-07 DIAGNOSIS — E109 Type 1 diabetes mellitus without complications: Secondary | ICD-10-CM | POA: Insufficient documentation

## 2021-01-07 MED ORDER — AMOXICILLIN 500 MG PO CAPS: 500.0000 mg | ORAL_CAPSULE | Freq: Three times a day (TID) | ORAL | 0 refills | Status: DC

## 2021-01-07 MED ORDER — ACETAMINOPHEN-CODEINE #3 300-30 MG PO TABS: 1.0000 | ORAL_TABLET | Freq: Four times a day (QID) | ORAL | 0 refills | Status: DC | PRN

## 2021-01-07 MED ORDER — CELECOXIB 200 MG PO CAPS: 200.0000 mg | ORAL_CAPSULE | Freq: Two times a day (BID) | ORAL | 0 refills | Status: DC

## 2021-01-07 NOTE — Discharge Instructions (Signed)
You have been seen by your caregiver because of dental pain.  °SEEK MEDICAL ATTENTION IF: °The exam and treatment you received today has been provided on an emergency basis only. This is not a substitute for complete medical or dental care. If your problem worsens or new symptoms (problems) appear, and you are unable to arrange prompt follow-up care with your dentist, call or return to this location. °CALL YOUR DENTIST OR RETURN IMMEDIATELY IF you develop a fever, rash, difficulty breathing or swallowing, neck or facial swelling, or other potentially serious concerns. ° ° °[employer]  °[employer]  °[facility]  °[address]  °[place], [place] [address]  °Phone [phone]  °[facility] [employer] [facility] [place], [place], exemplifies the Dental School?s vision to improve the health and quality of life of all [place]ians by creating leaders with a passion to care for the underserved and by leading the nation in community-based, service learning oral health education. °We are committed to offering comprehensive general dental services for adults, children and special needs patients in a safe, caring and professional setting. ° °Appointments: Our clinic is open Monday through Friday 8:00 a.m. until 5:00 p.m. The amount of time scheduled for an appointment depends on the patient?s specific needs. We ask that you keep your appointed time for care or provide 24-hour notice of all appointment changes. Parents or legal guardians must accompany minor children. ° °Payment for Services: Medicaid and other insurance plans are welcome. Payment for services is due when services are rendered and may be made by cash or credit card. If you have dental insurance, we will assist you with your claim submission.  ° °Emergencies: Emergency services will be provided Monday through Friday on a  walk-in basis. Please arrive early for emergency services. After hours emergency services will be provided for patients of record as required. ° °Services:  °Comprehensive General Dentistry  °Children?s Dentistry  °Oral Surgery - Extractions  °Root Canals  °Sealants and Tooth Colored Fillings  °Crowns and Bridges  °Dentures and Partial Dentures  °Implant Services  °Periodontal Services and Cleanings  °Cosmetic Tooth Whitening  °Digital Radiography  °3-D/[facility] ° ° °

## 2021-01-07 NOTE — ED Provider Notes (Signed)
[facility] DEPT Provider Note   CSN: [id number] Arrival date & time: 01/07/21  1137     History Chief Complaint  Patient presents with  . Dental Pain    Alan Burke is a 30 y.o. male with a past medical history of diabetes type 1.  The patient presents with complaint of dental pain.  He has a decayed left lower molar with recurrent infections.  Patient noted that he began having pain in the tooth which has been progressively worsening.  He has noticed some swelling in that area.  He has tried over-the-counter medications without relief.  He has had trouble accessing dental care as he currently has no insurance.  He denies fever or chills.  HPI     Past Medical History:  Diagnosis Date  . Diabetes mellitus without complication ([place])     There are no problems to display for this patient.   Past Surgical History:  Procedure Laterality Date  . HAND SURGERY         Family History  Problem Relation Age of Onset  . Heart disease Paternal Grandfather     Social History   Tobacco Use  . Smoking status: Never Smoker  . Smokeless tobacco: Never Used  Vaping Use  . Vaping Use: Never used  Substance Use Topics  . Alcohol use: Never  . Drug use: No    Home Medications Prior to Admission medications   Medication Sig Start Date End Date Taking? Authorizing Provider  amoxicillin (AMOXIL) 500 MG capsule Take 1 capsule (500 mg total) by mouth 3 (three) times daily. 10/03/17   Margarita Mail, PA-C  clindamycin (CLEOCIN) 150 MG capsule Take 2 capsules (300 mg total) by mouth every 6 (six) hours. 10/05/18   Kirichenko, Lahoma Rocker, PA-C  cyclobenzaprine (FLEXERIL) 10 MG tablet Take 1 tablet (10 mg total) by mouth 3 (three) times daily as needed for muscle spasms. 11/05/20   Orpah Greek, MD  Glucagon 3 MG/DOSE POWD Place 3 mg into the nose once as needed for up to 1 dose. 08/23/20   Philemon Kingdom, MD  Glucagon, rDNA, (GLUCAGON EMERGENCY)  1 MG KIT Please inject im as needed for hypoglycemia 08/23/20   Philemon Kingdom, MD  HYDROcodone-acetaminophen (NORCO) 5-325 MG tablet Take 1-2 tablets by mouth every 4 (four) hours as needed. 08/25/16   Harris, Vernie Shanks, PA-C  ibuprofen (ADVIL) 800 MG tablet Take 1 tablet (800 mg total) by mouth every 6 (six) hours as needed for moderate pain. 11/05/20   Orpah Greek, MD  insulin degludec (TRESIBA FLEXTOUCH) 100 UNIT/ML FlexTouch Pen Use as directed 08/23/20   Philemon Kingdom, MD  Insulin Lispro-aabc, 1 U Dial, (LYUMJEV KWIKPEN) 100 UNIT/ML SOPN Use as directed 08/23/20   Philemon Kingdom, MD  Insulin Pen Needle 32G X 4 MM MISC Use up to 5x a day as advised 08/24/20   Philemon Kingdom, MD  lidocaine (XYLOCAINE) 2 % solution Use as directed 15 mLs in the mouth or throat as needed for mouth pain. Or can apply topically with q-tip/cotton ball. 09/16/18   Larene Pickett, PA-C  magic mouthwash w/lidocaine SOLN Take 10 mLs by mouth 3 (three) times daily. 12/09/18   Marney Setting, NP    Allergies    Patient has no known allergies.  Review of Systems   Review of Systems  Constitutional: Negative for chills and fever.  HENT: Positive for dental problem. Negative for trouble swallowing and voice change.     Physical Exam Updated  Vital Signs BP 133/83   Pulse 79   Temp 97.7 F (36.5 C) (Oral)   Resp 18   SpO2 96%   Physical Exam Vitals and nursing note reviewed.  Constitutional:      General: He is not in acute distress.    Appearance: He is well-developed. He is not diaphoretic.  HENT:     Head: Normocephalic and atraumatic.     Mouth/Throat:   Eyes:     General: No scleral icterus.    Conjunctiva/sclera: Conjunctivae normal.  Cardiovascular:     Rate and Rhythm: Normal rate and regular rhythm.     Heart sounds: Normal heart sounds.  Pulmonary:     Effort: Pulmonary effort is normal. No respiratory distress.     Breath sounds: Normal breath sounds.  Abdominal:      Palpations: Abdomen is soft.     Tenderness: There is no abdominal tenderness.  Musculoskeletal:     Cervical back: Normal range of motion and neck supple.  Skin:    General: Skin is warm and dry.  Neurological:     Mental Status: He is alert.  Psychiatric:        Behavior: Behavior normal.     ED Results / Procedures / Treatments   Labs (all labs ordered are listed, but only abnormal results are displayed) Labs Reviewed - No data to display  EKG None  Radiology No results found.  Procedures Procedures   Medications Ordered in ED Medications - No data to display  ED Course  I have reviewed the triage vital signs and the nursing notes.  Pertinent labs & imaging results that were available during my care of the patient were reviewed by me and considered in my medical decision making (see chart for details).    MDM Rules/Calculators/A&P                         PDMP reviewed during this encounter.  Final Clinical Impression(s) / ED Diagnoses Final diagnoses:  None  Patient here with complaint of dental pain.  He has what appears to be early dental abscess from known dental carry.  I ordered pain medication, anti-inflammatory and amoxicillin.  Discussed outpatient follow-up.  No evidence of [place]'s angina or extensive infection requiring immediate surgical intervention.  Appears appropriate for discharge at this time PDMP reviewed during this encounter.   Rx / DC Orders ED Discharge Orders    None       Margarita Mail, PA-C 01/07/21 1304    Truddie Hidden, MD 01/07/21 1326

## 2021-01-07 NOTE — ED Notes (Signed)
Patient ambulatory out of department without discharge paperwork. Patient cussing at staff and states he is in too much pain to wait.

## 2021-01-07 NOTE — ED Triage Notes (Signed)
Patient reports left lower dental pain x a few weeks.

## 2021-01-18 ENCOUNTER — Other Ambulatory Visit: Payer: Self-pay

## 2021-01-18 ENCOUNTER — Emergency Department (HOSPITAL_COMMUNITY)
Admission: EM | Admit: 2021-01-18 | Discharge: 2021-01-18 | Disposition: A | Discharge status: Home / Self Care | Payer: Medicaid Other | Attending: Emergency Medicine | Admitting: Emergency Medicine

## 2021-01-18 DIAGNOSIS — E10649 Type 1 diabetes mellitus with hypoglycemia without coma: Secondary | ICD-10-CM

## 2021-01-18 DIAGNOSIS — R569 Unspecified convulsions: Secondary | ICD-10-CM | POA: Insufficient documentation

## 2021-01-18 DIAGNOSIS — K0889 Other specified disorders of teeth and supporting structures: Secondary | ICD-10-CM | POA: Insufficient documentation

## 2021-01-18 DIAGNOSIS — Z794 Long term (current) use of insulin: Secondary | ICD-10-CM | POA: Insufficient documentation

## 2021-01-18 LAB — CBC WITH DIFFERENTIAL/PLATELET
Abs Immature Granulocytes: 0.03 10*3/uL (ref 0.00–0.07)
Basophils Absolute: 0.1 10*3/uL (ref 0.0–0.1)
Basophils Relative: 1 %
Eosinophils Absolute: 0.1 10*3/uL (ref 0.0–0.5)
Eosinophils Relative: 2 %
HCT: 45.2 % (ref 39.0–52.0)
Hemoglobin: 14.1 g/dL (ref 13.0–17.0)
Immature Granulocytes: 0 %
Lymphocytes Relative: 34 %
Lymphs Abs: 2.4 10*3/uL (ref 0.7–4.0)
MCH: 28.2 pg (ref 26.0–34.0)
MCHC: 31.2 g/dL (ref 30.0–36.0)
MCV: 90.4 fL (ref 80.0–100.0)
Monocytes Absolute: 0.7 10*3/uL (ref 0.1–1.0)
Monocytes Relative: 11 %
Neutro Abs: 3.7 10*3/uL (ref 1.7–7.7)
Neutrophils Relative %: 52 %
Platelets: 214 10*3/uL (ref 150–400)
RBC: 5 MIL/uL (ref 4.22–5.81)
RDW: 14.2 % (ref 11.5–15.5)
WBC: 7 10*3/uL (ref 4.0–10.5)
nRBC: 0 % (ref 0.0–0.2)

## 2021-01-18 LAB — BASIC METABOLIC PANEL
Anion gap: 14 (ref 5–15)
BUN: 11 mg/dL (ref 6–20)
CO2: 23 mmol/L (ref 22–32)
Calcium: 9.7 mg/dL (ref 8.9–10.3)
Chloride: 106 mmol/L (ref 98–111)
Creatinine, Ser: 0.96 mg/dL (ref 0.61–1.24)
GFR, Estimated: >60 mL/min (ref >60)
Glucose, Bld: 64 mg/dL — ABNORMAL LOW (ref 70–99)
Potassium: 3.2 mmol/L — ABNORMAL LOW (ref 3.5–5.1)
Sodium: 143 mmol/L (ref 135–145)

## 2021-01-18 LAB — CBG MONITORING, ED
Glucose-Capillary: 21 mg/dL — CL (ref 70–99)
Glucose-Capillary: 67 mg/dL — ABNORMAL LOW (ref 70–99)
Glucose-Capillary: 111 mg/dL — ABNORMAL HIGH (ref 70–99)
Glucose-Capillary: 47 mg/dL — ABNORMAL LOW (ref 70–99)
Glucose-Capillary: 118 mg/dL — ABNORMAL HIGH (ref 70–99)

## 2021-01-18 MED ORDER — DEXTROSE 50 % IV SOLN: 1.0000 | Freq: Once | INTRAVENOUS | Status: AC

## 2021-01-18 MED ORDER — POTASSIUM CHLORIDE CRYS ER 20 MEQ PO TBCR
40.0000 meq | EXTENDED_RELEASE_TABLET | Freq: Once | ORAL | Status: AC
  Administered 2021-01-18: 40 meq via ORAL
  Filled 2021-01-18: qty 2

## 2021-01-18 MED ORDER — DEXTROSE 50 % IV SOLN
INTRAVENOUS | Status: AC
  Administered 2021-01-18: 50 mL via INTRAVENOUS
  Filled 2021-01-18: qty 50

## 2021-01-18 MED ORDER — CLINDAMYCIN HCL 150 MG PO CAPS: 450.0000 mg | ORAL_CAPSULE | Freq: Three times a day (TID) | ORAL | 0 refills | Status: AC

## 2021-01-18 MED ORDER — CLINDAMYCIN HCL 300 MG PO CAPS
450.0000 mg | ORAL_CAPSULE | Freq: Once | ORAL | Status: AC
  Administered 2021-01-18: 450 mg via ORAL
  Filled 2021-01-18: qty 1

## 2021-01-18 MED ORDER — KETOROLAC TROMETHAMINE 30 MG/ML IJ SOLN
30.0000 mg | Freq: Once | INTRAMUSCULAR | Status: AC
  Administered 2021-01-18: 30 mg via INTRAVENOUS
  Filled 2021-01-18: qty 1

## 2021-01-18 NOTE — ED Provider Notes (Signed)
[facility] DEPT Provider Note   CSN: [id number] Arrival date & time: 01/18/21  1215     History No chief complaint on file.   Alan Burke is a 30 y.o. male with past medical history significant for insulin-dependent diabetes presents to the ED with complaints of dental pain.  I reviewed patient's medical record and he was evaluated in the ER on 01/07/2021 for same complaints.  Patient's exam was largely benign and he was discharged home with antibiotics and analgesics.  He was encouraged to schedule an appointment with Dr. Haig Prophet, dentistry.  On my initial examination, patient did not want to speak.  He was making good eye contact, but was flat affect and ignoring my questions.  RN tells me that he exhibited same behavior towards her and states that he was frustrated because he felt as though staff were redundant with their questions.  Patient tells me that he took Augmentin for the entirety of his course, yet continues to endorse left-sided dental pain.  He states that he is insulin-dependent diabetic and is concerned for worsening infection given lack of improvement.  He reports trying to call the office of Dr. Haig Prophet, but he was unable to facilitate an appointment.  He denies any fevers or chills.  HPI     Past Medical History:  Diagnosis Date  . Diabetes mellitus without complication ([place])     There are no problems to display for this patient.   Past Surgical History:  Procedure Laterality Date  . HAND SURGERY         Family History  Problem Relation Age of Onset  . Heart disease Paternal Grandfather     Social History   Tobacco Use  . Smoking status: Never Smoker  . Smokeless tobacco: Never Used  Vaping Use  . Vaping Use: Never used  Substance Use Topics  . Alcohol use: Never  . Drug use: No    Home Medications Prior to Admission medications   Medication Sig Start Date End Date Taking? Authorizing Provider  clindamycin  (CLEOCIN) 150 MG capsule Take 3 capsules (450 mg total) by mouth 3 (three) times daily for 7 days. 01/18/21 01/25/21 Yes Corena Herter, PA-C  acetaminophen-codeine (TYLENOL #3) 300-30 MG tablet Take 1-2 tablets by mouth every 6 (six) hours as needed for moderate pain or severe pain. 01/07/21   Margarita Mail, PA-C  amoxicillin (AMOXIL) 500 MG capsule Take 1 capsule (500 mg total) by mouth 3 (three) times daily. 01/07/21   Margarita Mail, PA-C  celecoxib (CELEBREX) 200 MG capsule Take 1 capsule (200 mg total) by mouth 2 (two) times daily. 01/07/21   Margarita Mail, PA-C  cyclobenzaprine (FLEXERIL) 10 MG tablet Take 1 tablet (10 mg total) by mouth 3 (three) times daily as needed for muscle spasms. 11/05/20   Orpah Greek, MD  Glucagon 3 MG/DOSE POWD Place 3 mg into the nose once as needed for up to 1 dose. 08/23/20   Philemon Kingdom, MD  Glucagon, rDNA, (GLUCAGON EMERGENCY) 1 MG KIT Please inject im as needed for hypoglycemia 08/23/20   Philemon Kingdom, MD  ibuprofen (ADVIL) 800 MG tablet Take 1 tablet (800 mg total) by mouth every 6 (six) hours as needed for moderate pain. 11/05/20   Orpah Greek, MD  insulin degludec (TRESIBA FLEXTOUCH) 100 UNIT/ML FlexTouch Pen Use as directed 08/23/20   Philemon Kingdom, MD  Insulin Lispro-aabc, 1 U Dial, (LYUMJEV KWIKPEN) 100 UNIT/ML SOPN Use as directed 08/23/20   Philemon Kingdom,  MD  Insulin Pen Needle 32G X 4 MM MISC Use up to 5x a day as advised 08/24/20   Philemon Kingdom, MD  lidocaine (XYLOCAINE) 2 % solution Use as directed 15 mLs in the mouth or throat as needed for mouth pain. Or can apply topically with q-tip/cotton ball. 09/16/18   Larene Pickett, PA-C  magic mouthwash w/lidocaine SOLN Take 10 mLs by mouth 3 (three) times daily. 12/09/18   Marney Setting, NP    Allergies    Patient has no known allergies.  Review of Systems   Review of Systems  All other systems reviewed and are negative.   Physical Exam Updated Vital  Signs BP (!) 126/97   Pulse 80   Temp 97.9 F (36.6 C) (Oral)   Resp 20   SpO2 98%   Physical Exam Vitals and nursing note reviewed. Exam conducted with a chaperone present.  Constitutional:      General: He is not in acute distress.    Appearance: He is not toxic-appearing.  HENT:     Head: Normocephalic and atraumatic.     Mouth/Throat:     Pharynx: Oropharynx is clear. No oropharyngeal exudate or posterior oropharyngeal erythema.     Comments: Relatively poor dentition throughout.  No masses or areas of fluctuance concerning for abscess formation.  Uvula is midline.  No trismus.  Tolerating secretions well. Eyes:     General: No scleral icterus.    Conjunctiva/sclera: Conjunctivae normal.  Cardiovascular:     Rate and Rhythm: Normal rate.     Pulses: Normal pulses.  Pulmonary:     Effort: Pulmonary effort is normal. No respiratory distress.  Musculoskeletal:     Cervical back: Normal range of motion.  Skin:    General: Skin is dry.  Neurological:     Mental Status: He is alert. He is disoriented.     GCS: GCS eye subscore is 4. GCS verbal subscore is 5. GCS motor subscore is 6.  Psychiatric:        Mood and Affect: Mood normal.        Behavior: Behavior normal.        Thought Content: Thought content normal.     ED Results / Procedures / Treatments   Labs (all labs ordered are listed, but only abnormal results are displayed) Labs Reviewed  BASIC METABOLIC PANEL - Abnormal; Notable for the following components:      Result Value   Potassium 3.2 (*)    Glucose, Bld 64 (*)    All other components within normal limits  CBG MONITORING, ED - Abnormal; Notable for the following components:   Glucose-Capillary 21 (*)    All other components within normal limits  CBG MONITORING, ED - Abnormal; Notable for the following components:   Glucose-Capillary 67 (*)    All other components within normal limits  CBG MONITORING, ED - Abnormal; Notable for the following  components:   Glucose-Capillary 47 (*)    All other components within normal limits  CBG MONITORING, ED - Abnormal; Notable for the following components:   Glucose-Capillary 111 (*)    All other components within normal limits  CBG MONITORING, ED - Abnormal; Notable for the following components:   Glucose-Capillary 118 (*)    All other components within normal limits  CBC WITH DIFFERENTIAL/PLATELET    EKG None  Radiology No results found.  Procedures Procedures   Medications Ordered in ED Medications  dextrose 50 % solution 50 mL (50 mLs  Intravenous Given 01/18/21 1447)  ketorolac (TORADOL) 30 MG/ML injection 30 mg (30 mg Intravenous Given 01/18/21 1608)  clindamycin (CLEOCIN) capsule 450 mg (450 mg Oral Given 01/18/21 1608)  potassium chloride SA (KLOR-CON) CR tablet 40 mEq (40 mEq Oral Given 01/18/21 1608)    ED Course  I have reviewed the triage vital signs and the nursing notes.  Pertinent labs & imaging results that were available during my care of the patient were reviewed by me and considered in my medical decision making (see chart for details).    MDM Rules/Calculators/A&P                          Alan Burke was evaluated in Emergency Department on 01/18/2021 for the symptoms described in the history of present illness. He was evaluated in the context of the global COVID-19 pandemic, which necessitated consideration that the patient might be at risk for infection with the SARS-CoV-2 virus that causes COVID-19. Institutional protocols and algorithms that pertain to the evaluation of patients at risk for COVID-19 are in a state of rapid change based on information released by regulatory bodies including the CDC and federal and state organizations. These policies and algorithms were followed during the patient's care in the ED.  I personally reviewed patient's medical chart and all notes from triage and staff during today's encounter. I have also ordered and reviewed all labs  and imaging that I felt to be medically necessary in the evaluation of this patient's complaints and with consideration of their physical exam. If needed, translation services were available and utilized.   Patient was slow to respond and demonstrating unusual behavior throughout my exam.  He was having strained-thoughts and was exhibiting memory disturbance.  I decided to obtain point-of-care CBG which was critically low at 21.  He was still able to speak with me and was not obtunded.  I ordered basic labs.  I felt as though he would be able to eat and drink in order to elevate his blood sugars to normal range, but shortly after he was provided the food and I had left the room, he began to have rhythmic jerking consistent with seizure.  RN tells me that this occurred when she had attempted to start the IV.    His dental exam was benign.  Patient able to speak in complete sentences without difficulty swallowing or trouble handling secretions.  No stridor, wheezing, tripoding, grey pseudomembrane, trismus, uvular deviation, sublingual/submandibular/submental swelling or induration, nuchal rigidity, or neck pain.  Low suspicion for deep tissue infection such as mandibular or maxillary abscess, PTA or RPA, epiglottitis, or other emergent pathology.   On subsequent evaluation, patient is much improved.  He is eating and drinking well and his CBG has increased into the low 100s.  He is much more alert and attentive.  Patient was able to ambulate to and from the bathroom.  He is eating and drinking well.  No neuro deficits.  He did not hit his head, urinate in his pants, or bite his tongue.  He states that this is not the first time he has had a seizure related to hypoglycemia.  He feels prepared for discharge at this time.  He has been under observation for an additional couple of hours and has improved significantly.  I feel as though his request is reasonable.  He has good outpatient follow-up.  He states that he  does have an appointment with a dentist, but given  his ongoing pain symptoms, he suspected that he may need new antibiotic.  I feel as though this is reasonable.  Toradol IV here in the ER and will discharge him home with clindamycin.  ER return precautions discussed.  Patient voices understanding and is agreeable to the plan.   Final Clinical Impression(s) / ED Diagnoses Final diagnoses:  Pain, dental  Hypoglycemia due to type 1 diabetes mellitus ([place])  Seizure ([place])    Rx / DC Orders ED Discharge Orders         Ordered    clindamycin (CLEOCIN) 150 MG capsule  3 times daily        01/18/21 1602           Corena Herter, PA-C 01/18/21 1626    Horton, Alvin Critchley, DO 01/19/21 1010

## 2021-01-18 NOTE — ED Triage Notes (Signed)
Pt was seen here a few weeks ago with dental pain, pt states he was given antibiotics and pain meds. Pt states tooth has not gotten better and needs a refill on meds.

## 2021-01-18 NOTE — Discharge Instructions (Addendum)
Your ongoing dental pain suggest that perhaps the previously prescribed antibiotic was inadequate.  I have prescribed you a new antibiotic, clindamycin.  Please call your dentist to see if he can be placed on a call list in the event that a new appointment opens up sooner.  I recommend NSAIDs for continued relief of your discomfort.  You had a hypoglycemic seizure here in the ED today.  Please make your primary care provider aware of this event.  You may ultimately need to have your medications adjusted if you continue to have blood sugar volatility.    Return to the ED or seek immediate medical attention should you experience any new or worsening symptoms.

## 2021-03-18 ENCOUNTER — Emergency Department (HOSPITAL_COMMUNITY): Payer: Medicaid Other

## 2021-03-18 ENCOUNTER — Telehealth (HOSPITAL_COMMUNITY): Payer: Self-pay | Admitting: Student

## 2021-03-18 ENCOUNTER — Emergency Department (HOSPITAL_COMMUNITY)
Admission: EM | Admit: 2021-03-18 | Discharge: 2021-03-18 | Disposition: A | Discharge status: Home / Self Care | Payer: Medicaid Other | Attending: Emergency Medicine | Admitting: Emergency Medicine

## 2021-03-18 ENCOUNTER — Other Ambulatory Visit: Payer: Self-pay

## 2021-03-18 DIAGNOSIS — E119 Type 2 diabetes mellitus without complications: Secondary | ICD-10-CM | POA: Insufficient documentation

## 2021-03-18 DIAGNOSIS — Z794 Long term (current) use of insulin: Secondary | ICD-10-CM | POA: Insufficient documentation

## 2021-03-18 DIAGNOSIS — M79604 Pain in right leg: Secondary | ICD-10-CM | POA: Insufficient documentation

## 2021-03-18 DIAGNOSIS — M25561 Pain in right knee: Secondary | ICD-10-CM | POA: Insufficient documentation

## 2021-03-18 DIAGNOSIS — M25551 Pain in right hip: Secondary | ICD-10-CM | POA: Insufficient documentation

## 2021-03-18 MED ORDER — ACETAMINOPHEN 500 MG PO TABS
1000.0000 mg | ORAL_TABLET | Freq: Once | ORAL | Status: AC
  Administered 2021-03-18: 1000 mg via ORAL
  Filled 2021-03-18: qty 2

## 2021-03-18 MED ORDER — METHOCARBAMOL 500 MG PO TABS: 500.0000 mg | ORAL_TABLET | Freq: Two times a day (BID) | ORAL | 0 refills | Status: DC

## 2021-03-18 NOTE — Telephone Encounter (Signed)
Telephone encounter created to complete prescription outlined in patient's discharge paperwork, as initial prescription did not go through.

## 2021-03-18 NOTE — ED Provider Notes (Signed)
Emergency Medicine Provider Triage Evaluation Note  Alan Burke , a 30 y.o. male  was evaluated in triage.  Pt complains of right leg pain intermittently since 2/ 2022. Hx of DMT1, with neuropathy. Worsening tingling intermittently in right leg since fall. Not relieved by heat/ice, lidoderm patches, ibuprofen, tylenol. Worse with walking  Review of Systems  Positive: Rt hip, knee pain, parasthesias Negative: weakeness  Physical Exam  BP 125/82 (BP Location: Left Arm)   Pulse 80   Temp 98.2 F (36.8 C) (Oral)   Resp 16   Ht 5\' 8"  (1.727 m)   Wt 79.4 kg   SpO2 97%   BMI 26.61 kg/m  Gen:   Awake, no distress   Resp:  Normal effort  MSK:   Moves extremities without difficulty  Other:  Symmetric strength and weakness  Medical Decision Making  Medically screening exam initiated at 10:40 AM.  Appropriate orders placed.  Alan Burke was informed that the remainder of the evaluation will be completed by another provider, this initial triage assessment does not replace that evaluation, and the importance of remaining in the ED until their evaluation is complete.  This chart was dictated using voice recognition software, Dragon. Despite the best efforts of this provider to proofread and correct errors, errors may still occur which can change documentation meaning.    Arva Chafe 03/18/21 1044    05/18/21, MD 03/19/21 [phone]

## 2021-03-18 NOTE — Discharge Instructions (Addendum)
You were seen in the ER for your right leg pain.  Your physical exam and x-rays were very reassuring.  There is no broken bones or dislocations.  Recommended follow-up closely with the orthopedic provider listed below, Dr. Susa Simmonds.  You will likely require physical therapy.  Please call Dr. Donnie Mesa office first thing this afternoon to schedule follow-up appointment.  You may use topical pain relief such as Biofreeze or icy hot, heat application at home.  May continue use Tylenol or ibuprofen as needed.  Return to the ER if you develop any new numbness, tingling or weakness in your leg or any other sudden worsening of your symptoms, or any other new severe symptoms.

## 2021-03-18 NOTE — ED Triage Notes (Signed)
Patient reports he injured leg in February skating and has had leg pain since. Patient says pain would come and go but is now consistent. Pain rated 10/10
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
## 2021-03-18 NOTE — ED Notes (Signed)
Patient left before VS could be collected.

## 2021-03-31 ENCOUNTER — Other Ambulatory Visit: Payer: Self-pay

## 2021-03-31 ENCOUNTER — Encounter (HOSPITAL_COMMUNITY): Payer: Self-pay | Admitting: *Deleted

## 2021-03-31 ENCOUNTER — Emergency Department (HOSPITAL_COMMUNITY)
Admission: EM | Admit: 2021-03-31 | Discharge: 2021-04-01 | Discharge status: Against Medical Advice | Payer: Medicaid Other | Attending: Emergency Medicine | Admitting: Emergency Medicine

## 2021-03-31 DIAGNOSIS — Z5321 Procedure and treatment not carried out due to patient leaving prior to being seen by health care provider: Secondary | ICD-10-CM | POA: Insufficient documentation

## 2021-03-31 DIAGNOSIS — M79606 Pain in leg, unspecified: Secondary | ICD-10-CM | POA: Insufficient documentation

## 2021-03-31 DIAGNOSIS — K0889 Other specified disorders of teeth and supporting structures: Secondary | ICD-10-CM | POA: Insufficient documentation

## 2021-03-31 DIAGNOSIS — R519 Headache, unspecified: Secondary | ICD-10-CM | POA: Insufficient documentation
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
## 2021-04-01 NOTE — ED Notes (Addendum)
Patient wanting to check in at another time due to wait.

## 2021-04-05 ENCOUNTER — Other Ambulatory Visit: Payer: Self-pay

## 2021-04-05 ENCOUNTER — Emergency Department (HOSPITAL_COMMUNITY)
Admission: EM | Admit: 2021-04-05 | Discharge: 2021-04-06 | Disposition: A | Discharge status: Home / Self Care | Payer: Medicaid Other | Attending: Emergency Medicine | Admitting: Emergency Medicine

## 2021-04-05 ENCOUNTER — Telehealth: Payer: Self-pay | Admitting: Internal Medicine

## 2021-04-05 ENCOUNTER — Encounter (HOSPITAL_COMMUNITY): Payer: Self-pay | Admitting: Emergency Medicine

## 2021-04-05 DIAGNOSIS — M25551 Pain in right hip: Secondary | ICD-10-CM

## 2021-04-05 DIAGNOSIS — M545 Low back pain, unspecified: Secondary | ICD-10-CM | POA: Insufficient documentation

## 2021-04-05 DIAGNOSIS — Z794 Long term (current) use of insulin: Secondary | ICD-10-CM | POA: Insufficient documentation

## 2021-04-05 DIAGNOSIS — R109 Unspecified abdominal pain: Secondary | ICD-10-CM | POA: Insufficient documentation

## 2021-04-05 DIAGNOSIS — E119 Type 2 diabetes mellitus without complications: Secondary | ICD-10-CM | POA: Insufficient documentation

## 2021-04-05 NOTE — Telephone Encounter (Signed)
Patient called with questions/concerns about having Diabetic Neuropathy.  Patient was schedule for earliest available appointment - 06/20/21 @ 940AM and placed on wait list.  Stated would like a call back about Diabetic Neuropathy - call back # [phone]
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
## 2021-04-06 ENCOUNTER — Encounter (HOSPITAL_COMMUNITY): Payer: Self-pay | Admitting: Student

## 2021-04-06 ENCOUNTER — Emergency Department (HOSPITAL_COMMUNITY): Payer: Medicaid Other

## 2021-04-06 LAB — URINALYSIS, ROUTINE W REFLEX MICROSCOPIC
Bacteria, UA: NONE SEEN
Bilirubin Urine: NEGATIVE
Glucose, UA: >=500 mg/dL — AB
Hgb urine dipstick: NEGATIVE
Ketones, ur: NEGATIVE mg/dL
Leukocytes,Ua: NEGATIVE
Nitrite: NEGATIVE
Protein, ur: NEGATIVE mg/dL
Specific Gravity, Urine: 1.025 (ref 1.005–1.030)
pH: 6 (ref 5.0–8.0)

## 2021-04-06 LAB — CBC
HCT: 41.9 % (ref 39.0–52.0)
Hemoglobin: 13.6 g/dL (ref 13.0–17.0)
MCH: 28.5 pg (ref 26.0–34.0)
MCHC: 32.5 g/dL (ref 30.0–36.0)
MCV: 87.8 fL (ref 80.0–100.0)
Platelets: 216 10*3/uL (ref 150–400)
RBC: 4.77 MIL/uL (ref 4.22–5.81)
RDW: 12.8 % (ref 11.5–15.5)
WBC: 7.1 10*3/uL (ref 4.0–10.5)
nRBC: 0 % (ref 0.0–0.2)

## 2021-04-06 LAB — CK: Total CK: 276 U/L (ref 49–397)

## 2021-04-06 LAB — BASIC METABOLIC PANEL
Anion gap: 7 (ref 5–15)
BUN: 13 mg/dL (ref 6–20)
CO2: 27 mmol/L (ref 22–32)
Calcium: 9.6 mg/dL (ref 8.9–10.3)
Chloride: 105 mmol/L (ref 98–111)
Creatinine, Ser: 0.93 mg/dL (ref 0.61–1.24)
GFR, Estimated: >60 mL/min (ref >60)
Glucose, Bld: 95 mg/dL (ref 70–99)
Potassium: 4.2 mmol/L (ref 3.5–5.1)
Sodium: 139 mmol/L (ref 135–145)

## 2021-04-06 LAB — C-REACTIVE PROTEIN: CRP: 0.6 mg/dL (ref <1.0)

## 2021-04-06 LAB — SEDIMENTATION RATE: Sed Rate: 5 mm/hr (ref 0–16)

## 2021-04-06 MED ORDER — IBUPROFEN 800 MG PO TABS
800.0000 mg | ORAL_TABLET | Freq: Once | ORAL | Status: AC
  Administered 2021-04-06: 800 mg via ORAL
  Filled 2021-04-06: qty 1

## 2021-04-06 MED ORDER — MORPHINE SULFATE (PF) 4 MG/ML IV SOLN
4.0000 mg | Freq: Once | INTRAVENOUS | Status: AC
  Administered 2021-04-06: 4 mg via INTRAVENOUS
  Filled 2021-04-06: qty 1

## 2021-04-06 MED ORDER — METHOCARBAMOL 500 MG PO TABS: 500.0000 mg | ORAL_TABLET | Freq: Two times a day (BID) | ORAL | 0 refills | Status: DC

## 2021-04-06 MED ORDER — ONDANSETRON HCL 4 MG/2ML IJ SOLN
4.0000 mg | Freq: Once | INTRAMUSCULAR | Status: AC
  Administered 2021-04-06: 4 mg via INTRAVENOUS
  Filled 2021-04-06: qty 2

## 2021-04-06 MED ORDER — GADOBUTROL 1 MMOL/ML IV SOLN
8.0000 mL | Freq: Once | INTRAVENOUS | Status: AC | PRN
  Administered 2021-04-06: 8 mL via INTRAVENOUS

## 2021-04-06 NOTE — Discharge Instructions (Addendum)
Your MRI shows inflammation but no infection in your right hip.  Please see Tylenol ibuprofen as discussed below I also prescribed you a muscle relaxer to take as needed for pain I recommend taking this primarily at bedtime as this can cause some drowsiness.  Please drink plenty of water.  Please follow-up [facility].  Physical therapy be very helpful to this condition   Please use Tylenol or ibuprofen for pain.  You may use 600 mg ibuprofen every 6 hours or 1000 mg of Tylenol every 6 hours.  You may choose to alternate between the 2.  This would be most effective.  Not to exceed 4 g of Tylenol within 24 hours.  Not to exceed 3200 mg ibuprofen 24 hours.
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
## 2021-04-06 NOTE — ED Provider Notes (Signed)
Accepted handoff at shift change from [facility]. Please see prior provider note for more detail.   Briefly: Patient is 30 y.o.   DDX: concern for osteomyelitis though does not fit clinical picture abnormal CT scan.  MRI ordered.  Plan: Follow-up on MRI      Physical Exam  BP (!) 145/86   Pulse 71   Temp 98.1 F (36.7 C) (Oral)   Resp 18   SpO2 100%   Physical Exam Vitals and nursing note reviewed.  Constitutional:      General: He is not in acute distress. HENT:     Head: Normocephalic and atraumatic.     Nose: Nose normal.  Eyes:     General: No scleral icterus. Cardiovascular:     Rate and Rhythm: Normal rate and regular rhythm.     Pulses: Normal pulses.     Heart sounds: Normal heart sounds.  Pulmonary:     Effort: Pulmonary effort is normal. No respiratory distress.     Breath sounds: No wheezing.  Abdominal:     Palpations: Abdomen is soft.     Tenderness: There is no abdominal tenderness.  Musculoskeletal:     Cervical back: Normal range of motion.     Right lower leg: No edema.     Left lower leg: No edema.     Comments: Right hip tenderness to palpation.  Skin:    General: Skin is warm and dry.     Capillary Refill: Capillary refill takes less than 2 seconds.  Neurological:     Mental Status: He is alert. Mental status is at baseline.  Psychiatric:        Mood and Affect: Mood normal.        Behavior: Behavior normal.    ED Course/Procedures     Procedures  MDM  MRI confirms no evidence of osteomyelitis.  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  Agreeable to plan.       Gailen Shelter, [place] 04/06/21 1005    Koleen Distance, MD 04/06/21 [phone]

## 2021-04-06 NOTE — ED Notes (Signed)
Pt ambulated to bathroom with no assistance.  

## 2021-04-06 NOTE — ED Notes (Signed)
Patient transported to MRI 

## 2021-04-07 LAB — URINE CULTURE
Culture: NO GROWTH
Special Requests: NORMAL

## 2021-04-12 NOTE — Telephone Encounter (Signed)
Returned pt called  , not able reach patient, lvm.

## 2021-05-03 ENCOUNTER — Other Ambulatory Visit: Payer: Self-pay

## 2021-05-03 ENCOUNTER — Ambulatory Visit
Admission: EM | Admit: 2021-05-03 | Discharge: 2021-05-03 | Disposition: A | Discharge status: Home / Self Care | Payer: Medicaid Other | Attending: Family Medicine | Admitting: Family Medicine

## 2021-05-03 ENCOUNTER — Ambulatory Visit: Payer: Medicaid Other

## 2021-05-03 DIAGNOSIS — K047 Periapical abscess without sinus: Secondary | ICD-10-CM

## 2021-05-03 MED ORDER — AMOXICILLIN-POT CLAVULANATE 875-125 MG PO TABS: 1.0000 | ORAL_TABLET | Freq: Two times a day (BID) | ORAL | 0 refills | Status: DC

## 2021-05-03 MED ORDER — LIDOCAINE VISCOUS HCL 2 % MT SOLN: 10.0000 mL | OROMUCOSAL | 0 refills | Status: DC | PRN

## 2021-05-03 NOTE — ED Provider Notes (Signed)
[facility]    CSN: [id number] Arrival date & time: 05/03/21  1201      History   Chief Complaint Chief Complaint  Patient presents with   Dental Pain    HPI Alan Burke is a 30 y.o. male.   Patient presenting today with several day history of acute on chronic dental pain.  States has been dealing with this for quite some time now and has known significant dental decay.  Has already had several teeth pulled the past few months but saving up to get more pulled in the future.  The pain is severe at this point and not responsive to over-the-counter pain relievers, hot teas, salt water gargles and peroxide rinses.  No new dental injury.   Past Medical History:  Diagnosis Date   Diabetes mellitus without complication ([place])     There are no problems to display for this patient.   Past Surgical History:  Procedure Laterality Date   HAND SURGERY         Home Medications    Prior to Admission medications   Medication Sig Start Date End Date Taking? Authorizing Provider  amoxicillin-clavulanate (AUGMENTIN) 875-125 MG tablet Take 1 tablet by mouth every 12 (twelve) hours. 05/03/21  Yes Volney American, PA-C  lidocaine (XYLOCAINE) 2 % solution Use as directed 10 mLs in the mouth or throat as needed for mouth pain. 05/03/21  Yes Volney American, PA-C  acetaminophen-codeine (TYLENOL #3) 300-30 MG tablet Take 1-2 tablets by mouth every 6 (six) hours as needed for moderate pain or severe pain. 01/07/21   Margarita Mail, PA-C  amoxicillin (AMOXIL) 500 MG capsule Take 1 capsule (500 mg total) by mouth 3 (three) times daily. 01/07/21   Margarita Mail, PA-C  celecoxib (CELEBREX) 200 MG capsule Take 1 capsule (200 mg total) by mouth 2 (two) times daily. 01/07/21   Margarita Mail, PA-C  cyclobenzaprine (FLEXERIL) 10 MG tablet Take 1 tablet (10 mg total) by mouth 3 (three) times daily as needed for muscle spasms. 11/05/20   Orpah Greek, MD  Glucagon 3  MG/DOSE POWD Place 3 mg into the nose once as needed for up to 1 dose. 08/23/20   Philemon Kingdom, MD  Glucagon, rDNA, (GLUCAGON EMERGENCY) 1 MG KIT Please inject im as needed for hypoglycemia 08/23/20   Philemon Kingdom, MD  ibuprofen (ADVIL) 800 MG tablet Take 1 tablet (800 mg total) by mouth every 6 (six) hours as needed for moderate pain. 11/05/20   Orpah Greek, MD  insulin degludec (TRESIBA FLEXTOUCH) 100 UNIT/ML FlexTouch Pen Use as directed 08/23/20   Philemon Kingdom, MD  Insulin Lispro-aabc, 1 U Dial, (LYUMJEV KWIKPEN) 100 UNIT/ML SOPN Use as directed 08/23/20   Philemon Kingdom, MD  Insulin Pen Needle 32G X 4 MM MISC Use up to 5x a day as advised 08/24/20   Philemon Kingdom, MD  lidocaine (XYLOCAINE) 2 % solution Use as directed 15 mLs in the mouth or throat as needed for mouth pain. Or can apply topically with q-tip/cotton ball. 09/16/18   Larene Pickett, PA-C  magic mouthwash w/lidocaine SOLN Take 10 mLs by mouth 3 (three) times daily. 12/09/18   Marney Setting, NP  methocarbamol (ROBAXIN) 500 MG tablet Take 1 tablet (500 mg total) by mouth 2 (two) times daily. 04/06/21   Tedd Sias, PA    Family History Family History  Problem Relation Age of Onset   Heart disease Paternal Grandfather     Social History Social  History   Tobacco Use   Smoking status: Never   Smokeless tobacco: Never  Vaping Use   Vaping Use: Never used  Substance Use Topics   Alcohol use: Never   Drug use: No     Allergies   Patient has no known allergies.   Review of Systems Review of Systems Per HPI  Physical Exam Triage Vital Signs ED Triage Vitals  Enc Vitals Group     BP 05/03/21 1238 133/70     Pulse Rate 05/03/21 1238 70     Resp 05/03/21 1238 20     Temp 05/03/21 1238 97.6 F (36.4 C)     Temp Source 05/03/21 1238 Oral     SpO2 05/03/21 1238 98 %     Weight --      Height --      Head Circumference --      Peak Flow --      Pain Score 05/03/21 1248 10      Pain Loc --      Pain Edu? --      Excl. in [place]? --    No data found.  Updated Vital Signs BP 133/70 (BP Location: Left Arm)   Pulse 70   Temp 97.6 F (36.4 C) (Oral)   Resp 20   SpO2 98%   Visual Acuity Right Eye Distance:   Left Eye Distance:   Bilateral Distance:    Right Eye Near:   Left Eye Near:    Bilateral Near:     Physical Exam Vitals and nursing note reviewed.  Constitutional:      Appearance: Normal appearance.  HENT:     Head: Atraumatic.     Nose: Nose normal.     Mouth/Throat:     Mouth: Mucous membranes are moist.     Comments: Poor dentition diffusely across molars, gingival erythema and edema right molars on the lower Eyes:     Extraocular Movements: Extraocular movements intact.     Conjunctiva/sclera: Conjunctivae normal.  Cardiovascular:     Rate and Rhythm: Normal rate and regular rhythm.  Pulmonary:     Effort: Pulmonary effort is normal.     Breath sounds: Normal breath sounds.  Abdominal:     General: Bowel sounds are normal. There is no distension.     Palpations: Abdomen is soft.     Tenderness: There is no abdominal tenderness. There is no guarding.  Musculoskeletal:        General: Normal range of motion.     Cervical back: Normal range of motion and neck supple.  Lymphadenopathy:     Cervical: No cervical adenopathy.  Skin:    General: Skin is warm and dry.  Neurological:     General: No focal deficit present.     Mental Status: He is oriented to person, place, and time.  Psychiatric:        Mood and Affect: Mood normal.        Thought Content: Thought content normal.        Judgment: Judgment normal.     UC Treatments / Results  Labs (all labs ordered are listed, but only abnormal results are displayed) Labs Reviewed - No data to display  EKG   Radiology No results found.  Procedures Procedures (including critical care time)  Medications Ordered in UC Medications - No data to display  Initial Impression /  Assessment and Plan / UC Course  I have reviewed the triage vital signs and the  nursing notes.  Pertinent labs & imaging results that were available during my care of the patient were reviewed by me and considered in my medical decision making (see chart for details).     Will treat with Augmentin, viscous lidocaine, continued supportive over-the-counter measures.  Patient actively pursuing further dental care at this time.  Follow-up if worsening or not resolving.  Final Clinical Impressions(s) / UC Diagnoses   Final diagnoses:  Dental infection   Discharge Instructions   None    ED Prescriptions     Medication Sig Dispense Auth. Provider   amoxicillin-clavulanate (AUGMENTIN) 875-125 MG tablet Take 1 tablet by mouth every 12 (twelve) hours. 14 tablet Volney American, [place]   lidocaine (XYLOCAINE) 2 % solution Use as directed 10 mLs in the mouth or throat as needed for mouth pain. 200 mL Volney American, [place]      PDMP not reviewed this encounter.   Volney American, [place] 05/03/21 1324

## 2021-05-03 NOTE — ED Triage Notes (Signed)
Pt present dental pain, symptom started  a month ago. Pt states that he has some infection in the tooth.

## 2021-06-06 ENCOUNTER — Other Ambulatory Visit (INDEPENDENT_AMBULATORY_CARE_PROVIDER_SITE_OTHER): Payer: Self-pay

## 2021-06-07 ENCOUNTER — Other Ambulatory Visit (INDEPENDENT_AMBULATORY_CARE_PROVIDER_SITE_OTHER): Payer: Self-pay

## 2021-06-09 ENCOUNTER — Emergency Department (HOSPITAL_COMMUNITY): Payer: Medicaid Other

## 2021-06-09 ENCOUNTER — Emergency Department (HOSPITAL_COMMUNITY)
Admission: EM | Admit: 2021-06-09 | Discharge: 2021-06-09 | Disposition: A | Discharge status: Home / Self Care | Payer: Medicaid Other | Attending: Emergency Medicine | Admitting: Emergency Medicine

## 2021-06-09 ENCOUNTER — Other Ambulatory Visit: Payer: Self-pay

## 2021-06-09 DIAGNOSIS — W1789XA Other fall from one level to another, initial encounter: Secondary | ICD-10-CM | POA: Insufficient documentation

## 2021-06-09 DIAGNOSIS — M25519 Pain in unspecified shoulder: Secondary | ICD-10-CM

## 2021-06-09 DIAGNOSIS — Z760 Encounter for issue of repeat prescription: Secondary | ICD-10-CM | POA: Insufficient documentation

## 2021-06-09 DIAGNOSIS — M25512 Pain in left shoulder: Secondary | ICD-10-CM

## 2021-06-09 DIAGNOSIS — E119 Type 2 diabetes mellitus without complications: Secondary | ICD-10-CM | POA: Insufficient documentation

## 2021-06-09 DIAGNOSIS — Z794 Long term (current) use of insulin: Secondary | ICD-10-CM | POA: Insufficient documentation

## 2021-06-09 MED ORDER — AMOXICILLIN-POT CLAVULANATE 875-125 MG PO TABS: 1.0000 | ORAL_TABLET | Freq: Two times a day (BID) | ORAL | 0 refills | Status: AC

## 2021-06-09 MED ORDER — IBUPROFEN 800 MG PO TABS
800.0000 mg | ORAL_TABLET | Freq: Once | ORAL | Status: AC
  Administered 2021-06-09: 800 mg via ORAL
  Filled 2021-06-09: qty 1

## 2021-06-09 NOTE — Discharge Instructions (Addendum)
Please take Augmentin twice a day for the next 7 days.  Please take ibuprofen and Tylenol as needed for left shoulder pain.  Please follow-up with your dentist.

## 2021-06-09 NOTE — ED Provider Notes (Signed)
[facility] EMERGENCY DEPARTMENT Provider Note   CSN: [id number] Arrival date & time: 06/09/21  0744     History Chief Complaint  Patient presents with   Shoulder Injury   Medication Refill    Alan Burke Vital is a 30 y.o. male.  Patient is a 30 year old male with a past medical history of type 2 diabetes that is presenting for left shoulder pain.  Patient states that 7 days ago he was unloading a truck when he fell onto his left shoulder.  He states that he was 3 feet high.  He denies hitting his head or having midline spinal tenderness.  He had no loss of consciousness.  He is complaining of decreased range of motion of his left shoulder.  He denies any change in sensation.  He states that the pain is a 6 out of 10.  He has tried Tylenol and ibuprofen to help with pain.  Patient also complains of a tooth ache in his right upper molar.  He states this been going on for the last several months.  He was recently evaluated in urgent care in which he was prescribed amoxicillin and viscous lidocaine, however, he was unable to fill prescriptions.  He is requesting that we refill his amoxicillin.  He is being followed by [employer] and is going to be evaluated by the school of dentistry on September 18.  Patient denies any neck swelling, difficulty swallowing, trismus.   Shoulder Injury Pertinent negatives include no chest pain, no abdominal pain, no headaches and no shortness of breath.  Medication Refill     Past Medical History:  Diagnosis Date   Diabetes mellitus without complication ([place])     There are no problems to display for this patient.   Past Surgical History:  Procedure Laterality Date   HAND SURGERY         Family History  Problem Relation Age of Onset   Heart disease Paternal Grandfather     Social History   Tobacco Use   Smoking status: Never   Smokeless tobacco: Never  Vaping Use   Vaping Use: Never used  Substance Use Topics    Alcohol use: Never   Drug use: No    Home Medications Prior to Admission medications   Medication Sig Start Date End Date Taking? Authorizing Provider  amoxicillin-clavulanate (AUGMENTIN) 875-125 MG tablet Take 1 tablet by mouth every 12 (twelve) hours for 7 days. 06/09/21 06/16/21 Yes Doretha Sou, MD  acetaminophen-codeine (TYLENOL #3) 300-30 MG tablet Take 1-2 tablets by mouth every 6 (six) hours as needed for moderate pain or severe pain. 01/07/21   Margarita Mail, PA-C  amoxicillin (AMOXIL) 500 MG capsule Take 1 capsule (500 mg total) by mouth 3 (three) times daily. 01/07/21   Margarita Mail, PA-C  amoxicillin-clavulanate (AUGMENTIN) 875-125 MG tablet Take 1 tablet by mouth every 12 (twelve) hours. 05/03/21   Volney American, PA-C  celecoxib (CELEBREX) 200 MG capsule Take 1 capsule (200 mg total) by mouth 2 (two) times daily. 01/07/21   Margarita Mail, PA-C  cyclobenzaprine (FLEXERIL) 10 MG tablet Take 1 tablet (10 mg total) by mouth 3 (three) times daily as needed for muscle spasms. 11/05/20   Orpah Greek, MD  Glucagon 3 MG/DOSE POWD Place 3 mg into the nose once as needed for up to 1 dose. 08/23/20   Philemon Kingdom, MD  Glucagon, rDNA, (GLUCAGON EMERGENCY) 1 MG KIT Please inject im as needed for hypoglycemia 08/23/20   Philemon Kingdom, MD  ibuprofen (ADVIL) 800 MG tablet Take 1 tablet (800 mg total) by mouth every 6 (six) hours as needed for moderate pain. 11/05/20   Orpah Greek, MD  insulin degludec (TRESIBA FLEXTOUCH) 100 UNIT/ML FlexTouch Pen Use as directed 08/23/20   Philemon Kingdom, MD  Insulin Lispro-aabc, 1 U Dial, (LYUMJEV KWIKPEN) 100 UNIT/ML SOPN Use as directed 08/23/20   Philemon Kingdom, MD  Insulin Pen Needle 32G X 4 MM MISC Use up to 5x a day as advised 08/24/20   Philemon Kingdom, MD  lidocaine (XYLOCAINE) 2 % solution Use as directed 15 mLs in the mouth or throat as needed for mouth pain. Or can apply topically with q-tip/cotton ball. 09/16/18    Larene Pickett, PA-C  lidocaine (XYLOCAINE) 2 % solution Use as directed 10 mLs in the mouth or throat as needed for mouth pain. 05/03/21   Volney American, PA-C  magic mouthwash w/lidocaine SOLN Take 10 mLs by mouth 3 (three) times daily. 12/09/18   Marney Setting, NP  methocarbamol (ROBAXIN) 500 MG tablet Take 1 tablet (500 mg total) by mouth 2 (two) times daily. 04/06/21   Tedd Sias, PA    Allergies    Patient has no known allergies.  Review of Systems   Review of Systems  Constitutional:  Negative for chills, diaphoresis, fatigue and fever.  HENT:  Negative for congestion, dental problem, ear pain, facial swelling, hearing loss, nosebleeds, postnasal drip, rhinorrhea, sinus pressure, sinus pain, sneezing, sore throat, tinnitus, trouble swallowing and voice change.   Eyes:  Negative for photophobia, pain and visual disturbance.  Respiratory:  Negative for apnea, cough, choking, chest tightness, shortness of breath, wheezing and stridor.   Cardiovascular:  Negative for chest pain, palpitations and leg swelling.  Gastrointestinal:  Negative for abdominal distention, abdominal pain, constipation, diarrhea, nausea and vomiting.  Endocrine: Negative for polydipsia and polyuria.  Genitourinary:  Negative for difficulty urinating, dysuria, flank pain, frequency, hematuria and urgency.  Musculoskeletal:  Negative for gait problem, myalgias, neck pain and neck stiffness.       Left shoulder pain  Skin:  Negative for rash and wound.  Allergic/Immunologic: Negative for environmental allergies and food allergies.  Neurological:  Negative for dizziness, tremors, seizures, syncope, facial asymmetry, speech difficulty, light-headedness, numbness and headaches.  Psychiatric/Behavioral:  Negative for behavioral problems and confusion.   All other systems reviewed and are negative.  Physical Exam Updated Vital Signs BP 131/82   Pulse 74   Temp 98.2 F (36.8 C) (Oral)   Resp 15    Ht 5' 8" (1.727 m)   Wt 79.4 kg   SpO2 100%   BMI 26.61 kg/m   Physical Exam Vitals and nursing note reviewed.  Constitutional:      General: He is not in acute distress.    Appearance: Normal appearance. He is well-developed and normal weight.  HENT:     Head: Normocephalic and atraumatic.     Right Ear: External ear normal.     Left Ear: External ear normal.     Nose: Nose normal. No congestion.     Mouth/Throat:     Mouth: Mucous membranes are moist. No oral lesions.     Dentition: Normal dentition. No dental tenderness, gingival swelling, dental abscesses or gum lesions.     Pharynx: Oropharynx is clear. No oropharyngeal exudate or posterior oropharyngeal erythema.  Eyes:     General: No visual field deficit.    Extraocular Movements: Extraocular movements intact.  Conjunctiva/sclera: Conjunctivae normal.     Pupils: Pupils are equal, round, and reactive to light.  Cardiovascular:     Rate and Rhythm: Normal rate and regular rhythm.     Pulses: Normal pulses.     Heart sounds: Normal heart sounds. No murmur heard.   No friction rub. No gallop.  Pulmonary:     Effort: Pulmonary effort is normal. No respiratory distress.     Breath sounds: Normal breath sounds. No stridor. No wheezing, rhonchi or rales.  Chest:     Chest wall: No tenderness.  Abdominal:     General: Abdomen is flat. Bowel sounds are normal. There is no distension.     Palpations: Abdomen is soft.     Tenderness: There is no abdominal tenderness. There is no right CVA tenderness, left CVA tenderness, guarding or rebound.  Musculoskeletal:        General: No swelling.     Left shoulder: Tenderness present. Decreased range of motion. Normal pulse.     Cervical back: Normal range of motion and neck supple. No rigidity, tenderness or bony tenderness.     Thoracic back: Normal. No tenderness or bony tenderness.     Lumbar back: Normal. No tenderness or bony tenderness.     Right lower leg: No edema.      Left lower leg: No edema.  Skin:    General: Skin is warm and dry.  Neurological:     General: No focal deficit present.     Mental Status: He is alert and oriented to person, place, and time. Mental status is at baseline.     Cranial Nerves: Cranial nerves are intact. No cranial nerve deficit, dysarthria or facial asymmetry.     Sensory: Sensation is intact. No sensory deficit.     Motor: Motor function is intact. No weakness.     Coordination: Coordination is intact. Finger-Nose-Finger Test normal.     Gait: Gait is intact. Gait normal.  Psychiatric:        Mood and Affect: Mood normal.        Behavior: Behavior normal.        Thought Content: Thought content normal.        Judgment: Judgment normal.    ED Results / Procedures / Treatments   Labs (all labs ordered are listed, but only abnormal results are displayed) Labs Reviewed - No data to display  EKG None  Radiology DG Chest 1 View  Result Date: 06/09/2021 CLINICAL DATA:  30 year old male with history of left shoulder and arm pain and left-sided chest pain after an injury last week. EXAM: CHEST  1 VIEW COMPARISON:  Chest x-ray 06/20/2020. FINDINGS: Lung volumes are normal. No consolidative airspace disease. No pleural effusions. No pneumothorax. No pulmonary nodule or mass noted. Pulmonary vasculature and the cardiomediastinal silhouette are within normal limits. Bony thorax appears grossly intact. IMPRESSION: No radiographic evidence of acute cardiopulmonary disease. Electronically Signed   By: Vinnie Langton M.D.   On: 06/09/2021 09:09   DG Shoulder Left  Result Date: 06/09/2021 CLINICAL DATA:  30 year old male status post fall onto left shoulder. Pain. EXAM: LEFT SHOULDER - 2+ VIEW COMPARISON:  Chest radiographs 06/20/2020. FINDINGS: Bone mineralization is within normal limits. Os acromiale, normal variant. There is no evidence of fracture or dislocation. There is no evidence of arthropathy or other focal bone  abnormality. Negative visible left ribs and chest. IMPRESSION: Negative. Electronically Signed   By: Genevie Ann M.D.   On: 06/09/2021 09:12  Procedures Procedures   Medications Ordered in ED Medications  ibuprofen (ADVIL) tablet 800 mg (800 mg Oral Given 06/09/21 0947)    ED Course  I have reviewed the triage vital signs and the nursing notes.  Pertinent labs & imaging results that were available during my care of the patient were reviewed by me and considered in my medical decision making (see chart for details).    MDM Rules/Calculators/A&P                         Alan Burke is a 30 y.o. male with a past medical history of type 2 diabetes is presenting for left shoulder pain and right upper molar tooth ache.  Patient is hemodynamically stable and in no acute distress.  Patient has decreased range of motion with his left shoulder.  He is neurovascularly intact.  After the fall he denies hitting his head or having midline spinal tenderness.  On physical exam patient has muscular tenderness around his shoulder.  In regards to the patient's right upper molar tooth ache he was recently seen in which he did not fill his Augmentin.  He is still complaining of some dental pain.  He is being followed by [employer].  He is going to follow-up with the school of dentistry on September 18 for removal of molars.  On physical exam there are no signs of abscess, swelling or redness.  He has no trismus, difficulty swallowing, difficulty opening his mouth, voice change, neck swelling.  X-ray of his shoulder and cxr showed no fractures.   Patient was discharged home with augmentin for his tooth pain. He will follow up with his dentist and PCP.  Patient states compliance and understanding of the plan. I explained labs and imaging to the patient. No further questions at this time from the patient.  The patient is safe and stable for discharge at this time with return precautions provided and a plan for  follow-up care in place as needed  The plan for this patient was discussed with Dr. Reather Converse, who voiced agreement and who oversaw evaluation and treatment of this patient.   Final Clinical Impression(s) / ED Diagnoses Final diagnoses:  Left shoulder pain, unspecified chronicity    Rx / DC Orders ED Discharge Orders          Ordered    amoxicillin-clavulanate (AUGMENTIN) 875-125 MG tablet  Every 12 hours        06/09/21 0937             Doretha Sou, MD 06/09/21 1950    Elnora Morrison, MD 06/10/21 [phone]

## 2021-06-09 NOTE — ED Triage Notes (Signed)
Pt states he fell at work approximately one week prior injuring L shoulder. Pt denies head injury/LOC. Also states that he was seen at [facility] for dental pain and never filled his pain prescription and would like to have that refilled while evaluated.

## 2021-06-20 ENCOUNTER — Ambulatory Visit: Payer: Medicaid Other | Admitting: Internal Medicine

## 2021-07-06 ENCOUNTER — Ambulatory Visit: Payer: Medicaid Other | Admitting: Internal Medicine

## 2021-07-06 ENCOUNTER — Telehealth: Payer: Self-pay | Admitting: Internal Medicine

## 2021-07-06 ENCOUNTER — Encounter: Payer: Self-pay | Admitting: Internal Medicine

## 2021-07-06 NOTE — Telephone Encounter (Signed)
Patient dismissed form [facility] 07/06/2021

## 2021-07-06 NOTE — Progress Notes (Deleted)
Patient ID: Alan Burke, male   DOB: 14-Aug-1991, 30 y.o.   MRN: [id number]  This visit occurred during the SARS-CoV-2 public health emergency.  Safety protocols were in place, including screening questions prior to the visit, additional usage of staff PPE, and extensive cleaning of exam room while observing appropriate contact time as indicated for disinfecting solutions.   HPI: Alan Burke is a 30 y.o.-year-old male, initially referred by Dr. Davina Poke, returning for follow-up for DM1, diagnosed in 2001 (7th grade - Glu >900 in [place]), uncontrolled, with PN.  He previously saw endocrinology but not since he was 30 years old. Last visit 10 months ago. He did not return in 1 mo as advised. He moved to [place] in 2017.  He did not have insurance, now has Medicaid.  Interim history: No increased urination, blurry vision, nausea, chest pain. He has many ED visits since last visit including for hypoglycemia in 11/2020 and 01/2021 (POC Glu 21) after he could not eat due to dental pain and she had documented shaking episodes during these visits, ? Seizures.   Reviewed HbA1c levels: Lab Results  Component Value Date   HGBA1C 11.0 (H) 07/22/2020   He was previously on on: - ReliON NPH/Regular 70/30 vial (right now, using his mother's) 40 units twice a day right before breakfast and dinner  Then on: - Tresiba 36 units daily >> he was taking it every 40 hours!!! >> now daily - Lyumjev  7-8 >> 6 units before a meal if plan to be more active after the meal 10-14 >> 12 units before regular meals  Meter: Freestyle Lite  Pt checks his sugars 4 times a day: - am: 80-130 >> 115-130 - 2h after b'fast: n/c - before lunch: 190-200s >> 160-170 - 2h after lunch: n/c - before dinner: 30s-60s if skating, or gym, OTW 80s-200 >> snack/gatorade: 205-220 - 2h after dinner: n/c - bedtime: 200s-300 >> 115-140 - nighttime: 30-200s >> n/c Lowest sugar was 30s; he has hypoglycemia awareness in the 30s  occasionally. At last visit I sent prescription for glucagon to his pharmacy.  No previous hypoglycemia admission. Highest sugar was HI.  No previous DKA admissions.    Pt's meals are: - Breakfast: oatmeal + bagel - Lunch: homecooked: chilly with potatoes; egg and cheese on a bagel; beef patty - Dinner: homecooked: mac and cheese, chicken, fish - all baked - Snacks: candy Stopped [employer]. He exercises every other day: Gym, skating. He is self-employed.  -No CKD, last BUN/creatinine:  Lab Results  Component Value Date   BUN 13 04/06/2021   BUN 11 01/18/2021   CREATININE 0.93 04/06/2021   CREATININE 0.96 01/18/2021   -+ HL; last set of lipids: No results found for: CHOL, HDL, LDLCALC, LDLDIRECT, TRIG, CHOLHDL   - last eye exam was in 2020:?  DR, but was told that there was "a problem" with R eye (will send me the records).   -+ Numbness and tingling in his feet.  He had 5 teeth extracted recently.  He cannot afford implants right now.  No history of hypothyroidism.  No TSH available for review: No results found for: TSH  Pt has FH of DM in mother, MGF, MGM.  He used to dance hip-hop while in [place], at [age] years old.    ROS: Constitutional: no weight gain/no weight loss, + fatigue, + subjective hyperthermia, no subjective hypothermia, + poor sleep Eyes: no blurry vision, no xerophthalmia ENT: no sore throat, no nodules palpated in neck,  no dysphagia, no odynophagia, no hoarseness Cardiovascular: no CP/no SOB/no palpitations/no leg swelling Respiratory: no cough/no SOB/no wheezing Gastrointestinal: no N/no V/no D/no C/no acid reflux Musculoskeletal: + Muscle and joint aches Skin: no rashes, no hair loss Neurological: no tremors/+ numbness and tingling/no dizziness, + headaches  I reviewed pt's medications, allergies, PMH, social hx, family hx, and changes were documented in the history of present illness. Otherwise, unchanged from my initial visit note.  Past  Medical History:  Diagnosis Date   Diabetes mellitus without complication (HCC)    Past Surgical History:  Procedure Laterality Date   HAND SURGERY     Social History   Socioeconomic History   Marital status: Single    Spouse name: Not on file   Number of children: 2   Years of education: Not on file   Highest education level: Not on file  Occupational History   Occupation: photography  Tobacco Use   Smoking status: Never   Smokeless tobacco: Never  Vaping Use   Vaping Use: Never used  Substance and Sexual Activity   Alcohol use: Never   Drug use: No   Sexual activity: Not on file  Other Topics Concern   Not on file  Social History Narrative   Not on file   Social Determinants of Health   Financial Resource Strain: Not on file  Food Insecurity: Not on file  Transportation Needs: Not on file  Physical Activity: Not on file  Stress: Not on file  Social Connections: Not on file  Intimate Partner Violence: Not on file   Current Outpatient Medications on File Prior to Visit  Medication Sig Dispense Refill   acetaminophen-codeine (TYLENOL #3) 300-30 MG tablet Take 1-2 tablets by mouth every 6 (six) hours as needed for moderate pain or severe pain. 10 tablet 0   amoxicillin (AMOXIL) 500 MG capsule Take 1 capsule (500 mg total) by mouth 3 (three) times daily. 30 capsule 0   amoxicillin-clavulanate (AUGMENTIN) 875-125 MG tablet Take 1 tablet by mouth every 12 (twelve) hours. 14 tablet 0   celecoxib (CELEBREX) 200 MG capsule Take 1 capsule (200 mg total) by mouth 2 (two) times daily. 20 capsule 0   cyclobenzaprine (FLEXERIL) 10 MG tablet Take 1 tablet (10 mg total) by mouth 3 (three) times daily as needed for muscle spasms. 20 tablet 0   Glucagon 3 MG/DOSE POWD Place 3 mg into the nose once as needed for up to 1 dose. 1 each 11   Glucagon, rDNA, (GLUCAGON EMERGENCY) 1 MG KIT Please inject im as needed for hypoglycemia 1 kit 11   ibuprofen (ADVIL) 800 MG tablet Take 1 tablet  (800 mg total) by mouth every 6 (six) hours as needed for moderate pain. 20 tablet 0   insulin degludec (TRESIBA FLEXTOUCH) 100 UNIT/ML FlexTouch Pen Use as directed 100 mL 0   Insulin Lispro-aabc, 1 U Dial, (LYUMJEV KWIKPEN) 100 UNIT/ML SOPN Use as directed 100 mL 0   Insulin Pen Needle 32G X 4 MM MISC Use up to 5x a day as advised 400 each 3   lidocaine (XYLOCAINE) 2 % solution Use as directed 15 mLs in the mouth or throat as needed for mouth pain. Or can apply topically with q-tip/cotton ball. 150 mL 0   lidocaine (XYLOCAINE) 2 % solution Use as directed 10 mLs in the mouth or throat as needed for mouth pain. 200 mL 0   magic mouthwash w/lidocaine SOLN Take 10 mLs by mouth 3 (three) times daily. 50 mL  1   methocarbamol (ROBAXIN) 500 MG tablet Take 1 tablet (500 mg total) by mouth 2 (two) times daily. 20 tablet 0   No current facility-administered medications on file prior to visit.   No Known Allergies Family History  Problem Relation Age of Onset   Heart disease Paternal Grandfather    PE: There were no vitals taken for this visit. Wt Readings from Last 3 Encounters:  06/09/21 175 lb (79.4 kg)  03/31/21 175 lb 0.7 oz (79.4 kg)  03/18/21 175 lb (79.4 kg)   Constitutional: normal weight, in NAD Eyes: PERRLA, EOMI, no exophthalmos ENT: moist mucous membranes, no thyromegaly, no cervical lymphadenopathy Cardiovascular: RRR, No MRG Respiratory: CTA B Gastrointestinal: abdomen soft, NT, ND, BS+ Musculoskeletal: no deformities, strength intact in all 4 Skin: moist, warm, no rashes Neurological: no tremor with outstretched hands, DTR normal in all 4  ASSESSMENT: 1. DM1, uncontrolled, with with complications -Peripheral neuropathy -Possible DR-we will need records  -Lack of insurance  PLAN:  1. Patient with   longstanding, uncontrolled, type 1 diabetes.  He is self-employed and already applied for an insurance and he was telling me that this would start in December.  She was  using the 70/30 insulin regimen from [facility] and he was taking large doses of insulin, take her right before meals and at need for correction.  We discussed at last visit that this was not an appropriate regimen for him since this is inflexible and also the fact that it needed to be taken 30 minutes before meals.  I explained that this regimen was not really designed to correct his blood sugars more to take higher doses of mealtime insulin.  At last visit, since he had only approximately 1 month until he could get the new insurance, we designed a basal-bolus insulin regimen and I gave him samples [place] Aas and Lyumjev) to last him until his new insurance kicks in.  He was not counting carbs and we discussed about the referral to nutrition at our next visit.  I did advise him to stop bagels and also candy and other sweets. -He was interested in a CGM and also an insulin pump.  We discussed that I need to document compliance with coming for the visits and also with the recommended regimen before applying for a pump.   -However, he sent me several messages since last visit as he was confused about the regimen and we brought him back today to answer his questions and to give him more samples -At this visit, he tells me that he switched to the recommended regimen that he was actually taking [place] every 40 hours, after our discussion, as he misunderstood dosing instructions it did not look at the after visit summary.  He realized few days ago that he actually had to take [place] daily.  He started to feel better after starting to take it every day.  He also had problems with Lyumjev, initially sugars were very high (probably due to taking [place] every other day) and he started to inject more Lyumjev and limit his sweets along with spending more time at the gym, after which, he was dropping his sugars more.  However, this resolved after he started to take his [place] daily. -We reviewed his blood sugars from home and  as of now, they are at goal in the morning and at bedtime but they increase throughout the day, a sign of not enough rapid acting insulin.  We did discuss that his usual dose of  6 units before meals is probably not enough and I advised him to increase this to 8 units for meals if he plans to be active after the meal but he is higher doses for other regular meals and only occasionally using 14 units if he goes out to eat or has a holiday meal.  I also underlined the fact that he absolutely needs to take [place] every day. -Given samples for [place] since his insurance actually kicks in after the first of the year  - I suggested to: Patient Instructions  Please continue: - Tresiba 36 units every day - Lyumjev  6-8 units before a meal if plan to be more active after the meal 10-14 units before regular meals   Please return in 1.5 month.  - we checked his HbA1c: 7%  - advised to check sugars at different times of the day - 3-4x a day, rotating check times - advised for yearly eye exams >> he is UTD - return to clinic in 1.5 months   Philemon Kingdom, MD PhD [facility] Endocrinology

## 2021-07-23 ENCOUNTER — Other Ambulatory Visit: Payer: Self-pay

## 2021-07-23 ENCOUNTER — Ambulatory Visit (HOSPITAL_COMMUNITY)
Admission: EM | Admit: 2021-07-23 | Discharge: 2021-07-23 | Disposition: A | Discharge status: Home / Self Care | Payer: Medicaid Other | Attending: Student | Admitting: Student

## 2021-07-23 ENCOUNTER — Encounter (HOSPITAL_COMMUNITY): Payer: Self-pay

## 2021-07-23 DIAGNOSIS — K0889 Other specified disorders of teeth and supporting structures: Secondary | ICD-10-CM

## 2021-07-23 MED ORDER — KETOROLAC TROMETHAMINE 10 MG PO TABS: 10.0000 mg | ORAL_TABLET | Freq: Four times a day (QID) | ORAL | 0 refills | Status: DC | PRN

## 2021-07-23 MED ORDER — AMOXICILLIN-POT CLAVULANATE 875-125 MG PO TABS: 1.0000 | ORAL_TABLET | Freq: Two times a day (BID) | ORAL | 0 refills | Status: DC

## 2021-07-23 NOTE — ED Triage Notes (Signed)
Pt presents with tooth pain and states he has a tooth infection and states he has been having issues with his teeth x 4 months. States he has not been able to receive any antibiotic or pain medicine.

## 2021-07-23 NOTE — Discharge Instructions (Addendum)
-  Start the antibiotic-Augmentin (amoxicillin-clavulanate), 1 pill every 12 hours for 7 days.  You can take this with food like with breakfast and dinner. -Toradol (Ketorolac), one pill up to every 6 hours for pain. Make sure to take this with food. Avoid other NSAIDs like ibuprofen, alleve, naproxen while taking this medication.  -You can also take Tylenol 1000mg  up to 3x daily -Follow-up with dentist as scheduled

## 2021-07-23 NOTE — ED Provider Notes (Signed)
[facility]    CSN: [id number] Arrival date & time: 07/23/21  1504      History   Chief Complaint Chief Complaint  Patient presents with   Dental Pain    HPI Alan Burke is a 30 y.o. male presenting with dental pain for months.  Medical history dental infections in the past, diabetes.  States that he has had this dental pain right upper wisdom tooth for months, was actually seen for this at our [facility] location 2 months ago but could not afford the antibiotic.  He is followed by [facility] dental school and has an appoint with them on 10/31 for removal, but wants to make sure that the infection is under control before then.  Denies foul taste in mouth, pain under tongue, pain under jaw, trouble swallowing, sore throat.  HPI  Past Medical History:  Diagnosis Date   Diabetes mellitus without complication ([place])     There are no problems to display for this patient.   Past Surgical History:  Procedure Laterality Date   HAND SURGERY         Home Medications    Prior to Admission medications   Medication Sig Start Date End Date Taking? Authorizing Provider  amoxicillin-clavulanate (AUGMENTIN) 875-125 MG tablet Take 1 tablet by mouth every 12 (twelve) hours. 07/23/21  Yes Hazel Sams, PA-C  ketorolac (TORADOL) 10 MG tablet Take 1 tablet (10 mg total) by mouth every 6 (six) hours as needed. 07/23/21  Yes Hazel Sams, PA-C  acetaminophen-codeine (TYLENOL #3) 300-30 MG tablet Take 1-2 tablets by mouth every 6 (six) hours as needed for moderate pain or severe pain. 01/07/21   Margarita Mail, PA-C  amoxicillin (AMOXIL) 500 MG capsule Take 1 capsule (500 mg total) by mouth 3 (three) times daily. 01/07/21   Margarita Mail, PA-C  cyclobenzaprine (FLEXERIL) 10 MG tablet Take 1 tablet (10 mg total) by mouth 3 (three) times daily as needed for muscle spasms. 11/05/20   Orpah Greek, MD  Glucagon 3 MG/DOSE POWD Place 3 mg into the nose once as needed for up to 1 dose.  08/23/20   Philemon Kingdom, MD  Glucagon, rDNA, (GLUCAGON EMERGENCY) 1 MG KIT Please inject im as needed for hypoglycemia 08/23/20   Philemon Kingdom, MD  ibuprofen (ADVIL) 800 MG tablet Take 1 tablet (800 mg total) by mouth every 6 (six) hours as needed for moderate pain. 11/05/20   Orpah Greek, MD  insulin degludec (TRESIBA FLEXTOUCH) 100 UNIT/ML FlexTouch Pen Use as directed 08/23/20   Philemon Kingdom, MD  Insulin Lispro-aabc, 1 U Dial, (LYUMJEV KWIKPEN) 100 UNIT/ML SOPN Use as directed 08/23/20   Philemon Kingdom, MD  Insulin Pen Needle 32G X 4 MM MISC Use up to 5x a day as advised 08/24/20   Philemon Kingdom, MD  lidocaine (XYLOCAINE) 2 % solution Use as directed 15 mLs in the mouth or throat as needed for mouth pain. Or can apply topically with q-tip/cotton ball. 09/16/18   Larene Pickett, PA-C  lidocaine (XYLOCAINE) 2 % solution Use as directed 10 mLs in the mouth or throat as needed for mouth pain. 05/03/21   Volney American, PA-C  magic mouthwash w/lidocaine SOLN Take 10 mLs by mouth 3 (three) times daily. 12/09/18   Marney Setting, NP  methocarbamol (ROBAXIN) 500 MG tablet Take 1 tablet (500 mg total) by mouth 2 (two) times daily. 04/06/21   Tedd Sias, PA    Family History Family History  Problem  Relation Age of Onset   Heart disease Paternal Grandfather     Social History Social History   Tobacco Use   Smoking status: Never   Smokeless tobacco: Never  Vaping Use   Vaping Use: Never used  Substance Use Topics   Alcohol use: Never   Drug use: No     Allergies   Patient has no known allergies.   Review of Systems Review of Systems  HENT:  Positive for dental problem.   All other systems reviewed and are negative.   Physical Exam Triage Vital Signs ED Triage Vitals  Enc Vitals Group     BP 07/23/21 1633 (!) 145/94     Pulse Rate 07/23/21 1633 87     Resp --      Temp --      Temp src --      SpO2 07/23/21 1633 99 %     Weight --       Height --      Head Circumference --      Peak Flow --      Pain Score 07/23/21 1635 10     Pain Loc --      Pain Edu? --      Excl. in [place]? --    No data found.  Updated Vital Signs BP (!) 145/94 (BP Location: Right Arm)   Pulse 87   SpO2 99%   Visual Acuity Right Eye Distance:   Left Eye Distance:   Bilateral Distance:    Right Eye Near:   Left Eye Near:    Bilateral Near:     Physical Exam Vitals reviewed.  Constitutional:      General: He is not in acute distress.    Appearance: Normal appearance. He is not ill-appearing, toxic-appearing or diaphoretic.  HENT:     Head: Normocephalic and atraumatic.     Jaw: There is normal jaw occlusion. No trismus, tenderness, swelling, pain on movement or malocclusion.     Salivary Glands: Right salivary gland is not diffusely enlarged or tender. Left salivary gland is not diffusely enlarged or tender.     Right Ear: Hearing normal.     Left Ear: Hearing normal.     Nose: Nose normal.     Mouth/Throat:     Lips: Pink.     Mouth: Mucous membranes are moist. No lacerations or oral lesions.     Dentition: Abnormal dentition. Does not have dentures. Dental tenderness and dental caries present.     Tongue: No lesions. Tongue does not deviate from midline.     Palate: No mass.     Pharynx: Oropharynx is clear. Uvula midline. No oropharyngeal exudate or posterior oropharyngeal erythema.     Tonsils: No tonsillar exudate or tonsillar abscesses.     Comments: Poor dentician  R upper wisdom tooth appears to be impacted. Surrounding gingival tenderness. No trismus, drooling, sore throat, voice changes, swelling underneath the tongue, swelling underneath the jaw, neck stiffness.  Eyes:     Extraocular Movements: Extraocular movements intact.     Pupils: Pupils are equal, round, and reactive to light.  Pulmonary:     Effort: Pulmonary effort is normal.  Neurological:     General: No focal deficit present.     Mental Status: He is  alert and oriented to person, place, and time.  Psychiatric:        Mood and Affect: Mood normal.        Behavior: Behavior normal.  Thought Content: Thought content normal.        Judgment: Judgment normal.     UC Treatments / Results  Labs (all labs ordered are listed, but only abnormal results are displayed) Labs Reviewed - No data to display  EKG   Radiology No results found.  Procedures Procedures (including critical care time)  Medications Ordered in UC Medications - No data to display  Initial Impression / Assessment and Plan / UC Course  I have reviewed the triage vital signs and the nursing notes.  Pertinent labs & imaging results that were available during my care of the patient were reviewed by me and considered in my medical decision making (see chart for details).     This patient is a very pleasant 30 y.o. year old male presenting with dental pain/infection. Afebrile, nontachy. Augmentin, toradol. Last creatinine wnl.  He has an appointment with dental school 10/31 for extraction, follow-up with him as scheduled. ED return precautions discussed. Patient verbalizes understanding and agreement.    Final Clinical Impressions(s) / UC Diagnoses   Final diagnoses:  Pain, dental     Discharge Instructions      -Start the antibiotic-Augmentin (amoxicillin-clavulanate), 1 pill every 12 hours for 7 days.  You can take this with food like with breakfast and dinner. -Toradol (Ketorolac), one pill up to every 6 hours for pain. Make sure to take this with food. Avoid other NSAIDs like ibuprofen, alleve, naproxen while taking this medication.  -You can also take Tylenol 1085m up to 3x daily -Follow-up with dentist as scheduled   ED Prescriptions     Medication Sig Dispense Auth. Provider   amoxicillin-clavulanate (AUGMENTIN) 875-125 MG tablet Take 1 tablet by mouth every 12 (twelve) hours. 14 tablet GHazel Sams PA-C   ketorolac (TORADOL) 10 MG tablet  Take 1 tablet (10 mg total) by mouth every 6 (six) hours as needed. 20 tablet GHazel Sams PA-C      PDMP not reviewed this encounter.   GHazel Sams PA-C 07/23/21 1731

## 2021-08-25 ENCOUNTER — Other Ambulatory Visit: Payer: Self-pay

## 2021-08-25 ENCOUNTER — Emergency Department (HOSPITAL_COMMUNITY): Payer: Self-pay

## 2021-08-25 ENCOUNTER — Emergency Department (HOSPITAL_COMMUNITY)
Admission: EM | Admit: 2021-08-25 | Discharge: 2021-08-26 | Disposition: A | Discharge status: Home / Self Care | Payer: Self-pay | Attending: Medical | Admitting: Medical

## 2021-08-25 DIAGNOSIS — M7989 Other specified soft tissue disorders: Secondary | ICD-10-CM | POA: Insufficient documentation

## 2021-08-25 DIAGNOSIS — E109 Type 1 diabetes mellitus without complications: Secondary | ICD-10-CM | POA: Insufficient documentation

## 2021-08-25 DIAGNOSIS — M791 Myalgia, unspecified site: Secondary | ICD-10-CM | POA: Insufficient documentation

## 2021-08-25 DIAGNOSIS — R079 Chest pain, unspecified: Secondary | ICD-10-CM | POA: Insufficient documentation

## 2021-08-25 DIAGNOSIS — Z5321 Procedure and treatment not carried out due to patient leaving prior to being seen by health care provider: Secondary | ICD-10-CM | POA: Insufficient documentation

## 2021-08-25 LAB — CBC WITH DIFFERENTIAL/PLATELET
Abs Immature Granulocytes: 0.01 10*3/uL (ref 0.00–0.07)
Basophils Absolute: 0 10*3/uL (ref 0.0–0.1)
Basophils Relative: 1 %
Eosinophils Absolute: 0.1 10*3/uL (ref 0.0–0.5)
Eosinophils Relative: 2 %
HCT: 42.2 % (ref 39.0–52.0)
Hemoglobin: 13.8 g/dL (ref 13.0–17.0)
Immature Granulocytes: 0 %
Lymphocytes Relative: 26 %
Lymphs Abs: 1.8 10*3/uL (ref 0.7–4.0)
MCH: 28.3 pg (ref 26.0–34.0)
MCHC: 32.7 g/dL (ref 30.0–36.0)
MCV: 86.5 fL (ref 80.0–100.0)
Monocytes Absolute: 0.5 10*3/uL (ref 0.1–1.0)
Monocytes Relative: 8 %
Neutro Abs: 4.4 10*3/uL (ref 1.7–7.7)
Neutrophils Relative %: 63 %
Platelets: 177 10*3/uL (ref 150–400)
RBC: 4.88 MIL/uL (ref 4.22–5.81)
RDW: 13.1 % (ref 11.5–15.5)
WBC: 6.8 10*3/uL (ref 4.0–10.5)
nRBC: 0 % (ref 0.0–0.2)

## 2021-08-25 LAB — COMPREHENSIVE METABOLIC PANEL
ALT: 23 U/L (ref 0–44)
AST: 32 U/L (ref 15–41)
Albumin: 3.8 g/dL (ref 3.5–5.0)
Alkaline Phosphatase: 65 U/L (ref 38–126)
Anion gap: 8 (ref 5–15)
BUN: 9 mg/dL (ref 6–20)
CO2: 28 mmol/L (ref 22–32)
Calcium: 9.5 mg/dL (ref 8.9–10.3)
Chloride: 98 mmol/L (ref 98–111)
Creatinine, Ser: 0.93 mg/dL (ref 0.61–1.24)
GFR, Estimated: >60 mL/min (ref >60)
Glucose, Bld: 267 mg/dL — ABNORMAL HIGH (ref 70–99)
Potassium: 3.9 mmol/L (ref 3.5–5.1)
Sodium: 134 mmol/L — ABNORMAL LOW (ref 135–145)
Total Bilirubin: 0.6 mg/dL (ref 0.3–1.2)
Total Protein: 6.9 g/dL (ref 6.5–8.1)

## 2021-08-25 LAB — CK: Total CK: 209 U/L (ref 49–397)

## 2021-08-25 LAB — TROPONIN I (HIGH SENSITIVITY)
Troponin I (High Sensitivity): 4 ng/L (ref <18)
Troponin I (High Sensitivity): 4 ng/L (ref <18)

## 2021-08-25 LAB — BRAIN NATRIURETIC PEPTIDE: B Natriuretic Peptide: 7.2 pg/mL (ref 0.0–100.0)

## 2021-08-25 NOTE — ED Provider Notes (Signed)
Emergency Medicine Provider Triage Evaluation Note  Alan Burke , a 30 y.o. male  was evaluated in triage.  Pt complains of double complaints.  Patient reports that since February of this year he has been having chest pain.  He reports that he was seen sometime last year for similar chest pain and told he had a "infection around his heart."  He states that he has been dealing with the pain and taking pain medication however it seems to be getting worse.  He also complains of pain throughout his entire body.  He feels like his hands and feet are swelling and he is unsure if it is related to the neuropathy.  He states that he is compliant on his Novolin 70/30.  He is a type I diabetic.  He denies any shortness of breath with the chest pain.  Denies any fevers or chills.   Review of Systems  Positive: + chest pain, body pain, swelling Negative: - fevers, chills, SOB, nausea, vomiting  Physical Exam  BP (!) 144/94   Pulse 94   Temp 98.1 F (36.7 C) (Oral)   Resp 18   Ht 5\' 8"  (1.727 m)   Wt 79.4 kg   SpO2 100%   BMI 26.61 kg/m  Gen:   Awake, no distress   Resp:  Normal effort  MSK:   Moves extremities without difficulty  Other:  No obvious swelling appreciated at this time  Medical Decision Making  Medically screening exam initiated at 4:22 PM.  Appropriate orders placed.  Taro Hidrogo was informed that the remainder of the evaluation will be completed by another provider, this initial triage assessment does not replace that evaluation, and the importance of remaining in the ED until their evaluation is complete.     Arva Chafe, PA-C 08/25/21 1624    13/11/22, MD 08/26/21 [phone]

## 2021-08-25 NOTE — ED Triage Notes (Signed)
Patient presents with multiple complaints; stated he is DM type 1 and is on Novolin 70/30; stated he's been having difficulty sleeping, swelling in both arms and legs; stated he right thigh "weird spasm" while urinating. Patient is also c/o intermittent chest pain,

## 2021-08-25 NOTE — ED Notes (Signed)
X3 no response 

## 2021-08-25 NOTE — ED Notes (Signed)
Patient called x1 for vitals with no response 

## 2021-09-04 ENCOUNTER — Emergency Department (HOSPITAL_COMMUNITY)
Admission: EM | Admit: 2021-09-04 | Discharge: 2021-09-04 | Disposition: A | Discharge status: Home / Self Care | Payer: Self-pay | Attending: Emergency Medicine | Admitting: Emergency Medicine

## 2021-09-04 ENCOUNTER — Other Ambulatory Visit: Payer: Self-pay

## 2021-09-04 ENCOUNTER — Encounter (HOSPITAL_COMMUNITY): Payer: Self-pay

## 2021-09-04 DIAGNOSIS — E119 Type 2 diabetes mellitus without complications: Secondary | ICD-10-CM | POA: Insufficient documentation

## 2021-09-04 DIAGNOSIS — M7918 Myalgia, other site: Secondary | ICD-10-CM

## 2021-09-04 DIAGNOSIS — Z794 Long term (current) use of insulin: Secondary | ICD-10-CM | POA: Insufficient documentation

## 2021-09-04 DIAGNOSIS — R079 Chest pain, unspecified: Secondary | ICD-10-CM | POA: Insufficient documentation

## 2021-09-04 LAB — COMPREHENSIVE METABOLIC PANEL
ALT: 23 U/L (ref 0–44)
AST: 32 U/L (ref 15–41)
Albumin: 4 g/dL (ref 3.5–5.0)
Alkaline Phosphatase: 70 U/L (ref 38–126)
Anion gap: 6 (ref 5–15)
BUN: 12 mg/dL (ref 6–20)
CO2: 26 mmol/L (ref 22–32)
Calcium: 9.6 mg/dL (ref 8.9–10.3)
Chloride: 107 mmol/L (ref 98–111)
Creatinine, Ser: 0.92 mg/dL (ref 0.61–1.24)
GFR, Estimated: >60 mL/min (ref >60)
Glucose, Bld: 52 mg/dL — ABNORMAL LOW (ref 70–99)
Potassium: 3.9 mmol/L (ref 3.5–5.1)
Sodium: 139 mmol/L (ref 135–145)
Total Bilirubin: 0.7 mg/dL (ref 0.3–1.2)
Total Protein: 7.6 g/dL (ref 6.5–8.1)

## 2021-09-04 LAB — CBC WITH DIFFERENTIAL/PLATELET
Abs Immature Granulocytes: 0.01 10*3/uL (ref 0.00–0.07)
Basophils Absolute: 0 10*3/uL (ref 0.0–0.1)
Basophils Relative: 1 %
Eosinophils Absolute: 0.1 10*3/uL (ref 0.0–0.5)
Eosinophils Relative: 2 %
HCT: 42.8 % (ref 39.0–52.0)
Hemoglobin: 13.9 g/dL (ref 13.0–17.0)
Immature Granulocytes: 0 %
Lymphocytes Relative: 39 %
Lymphs Abs: 2.7 10*3/uL (ref 0.7–4.0)
MCH: 28.2 pg (ref 26.0–34.0)
MCHC: 32.5 g/dL (ref 30.0–36.0)
MCV: 86.8 fL (ref 80.0–100.0)
Monocytes Absolute: 0.7 10*3/uL (ref 0.1–1.0)
Monocytes Relative: 10 %
Neutro Abs: 3.4 10*3/uL (ref 1.7–7.7)
Neutrophils Relative %: 48 %
Platelets: 198 10*3/uL (ref 150–400)
RBC: 4.93 MIL/uL (ref 4.22–5.81)
RDW: 13 % (ref 11.5–15.5)
WBC: 6.9 10*3/uL (ref 4.0–10.5)
nRBC: 0 % (ref 0.0–0.2)

## 2021-09-04 LAB — URINALYSIS, ROUTINE W REFLEX MICROSCOPIC
Bilirubin Urine: NEGATIVE
Glucose, UA: NEGATIVE mg/dL
Hgb urine dipstick: NEGATIVE
Ketones, ur: NEGATIVE mg/dL
Leukocytes,Ua: NEGATIVE
Nitrite: NEGATIVE
Protein, ur: NEGATIVE mg/dL
Specific Gravity, Urine: 1.018 (ref 1.005–1.030)
pH: 6 (ref 5.0–8.0)

## 2021-09-04 LAB — CBG MONITORING, ED
Glucose-Capillary: 62 mg/dL — ABNORMAL LOW (ref 70–99)
Glucose-Capillary: 63 mg/dL — ABNORMAL LOW (ref 70–99)
Glucose-Capillary: 108 mg/dL — ABNORMAL HIGH (ref 70–99)

## 2021-09-04 LAB — LIPASE, BLOOD: Lipase: 28 U/L (ref 11–51)

## 2021-09-04 NOTE — ED Provider Notes (Signed)
Emergency Medicine Provider Triage Evaluation Note  Alan Burke , a 30 y.o. male  was evaluated in triage.  Pt complains of full body pain.  He has multiple complaints.  He states that for 1 year he has had tingling in his fingers, toes and generalized body aches in his legs, arms, back.  He complains of swelling to his feet and hands over the past few months.  He thinks that all of this is related to his type 1 diabetes.  He states that he has been having episodes of hypoglycemia and hyperglycemia.  He wakes up with daily nausea.  He denies any fevers, abdominal pain, diarrhea.  Review of Systems  Positive: See above Negative:   Physical Exam  BP 128/83 (BP Location: Left Arm)   Pulse 84   Temp 97.6 F (36.4 C) (Oral)   Resp 16   Ht 5\' 8"  (1.727 m)   Wt 79.4 kg   SpO2 95%   BMI 26.61 kg/m  Gen:   Awake, no distress   Resp:  Normal effort  MSK:   Moves extremities without difficulty  Other:  No appreciable swelling to his bilateral upper or lower extremities.  Abdomen is soft and nontender.  Medical Decision Making  Medically screening exam initiated at 2:31 PM.  Appropriate orders placed.  Berdell Hostetler was informed that the remainder of the evaluation will be completed by another provider, this initial triage assessment does not replace that evaluation, and the importance of remaining in the ED until their evaluation is complete.     Arva Chafe, PA-C 09/04/21 1432    09/06/21, MD 09/04/21 [phone]

## 2021-09-04 NOTE — ED Notes (Signed)
Pt states understanding of dc instructions and importance of follow up. Pt denies questions or concerns upon dc. Pt declined wheelchair assistance upon dc. Pt ambulated out of ed w/ steady gait. No belongings left in room upon dc. ° °

## 2021-09-04 NOTE — Care Management (Signed)
ED RN Care Manager  received [facility] consult and discussed transitional care plan to assist with connecting patient to primary care.  Forwarded patient's information to [facility]. Scheduler will contact patient to schedule a f/u appt., also provided patient with clinic's  contact information.  Updated Dr. Fredderick Phenix on plan  she is agreeable no new prescriptions written at this time.

## 2021-09-04 NOTE — Discharge Instructions (Signed)
Follow-up with your primary care doctor as discussed.  Return here as needed for any worsening symptoms. 

## 2021-09-04 NOTE — ED Provider Notes (Signed)
[facility] DEPT Provider Note   CSN: [id number] Arrival date & time: 09/04/21  1416     History Chief Complaint  Patient presents with   body pain    Alan Burke is a 30 y.o. male.  Patient is a 30 year old male with a history of type 1 diabetes who presents with body aches.  He states for over a year he has had some increasing pain all over.  He says he hurts in his joints and his muscles and its pretty much all over.  It started last year but its been fairly constant since February.  He denies any noticeable joint swelling but he does feel like his hands and his feet get swollen from time to time.  He has some tingling in his hands and his feet.  He has pain in his chest that hurts pretty much every day.  No associated shortness of breath.  No exertional symptoms.  No fevers.  No vomiting.  He has had diabetes since he was in second grade.  He currently has no PCP.  He has been obtaining insulin 70/30 from [facility] over-the-counter but he says his diabetes has not been managed well and he feels like his symptoms are related to that.  He does not have a PCP because he does not have insurance.  He previously saw an endocrinologist earlier this spring but has not been able to go back because of insurance reasons.      Past Medical History:  Diagnosis Date   Diabetes mellitus without complication ([place])     There are no problems to display for this patient.   Past Surgical History:  Procedure Laterality Date   HAND SURGERY         Family History  Problem Relation Age of Onset   Heart disease Paternal Grandfather     Social History   Tobacco Use   Smoking status: Never   Smokeless tobacco: Never  Vaping Use   Vaping Use: Never used  Substance Use Topics   Alcohol use: Never   Drug use: No    Home Medications Prior to Admission medications   Medication Sig Start Date End Date Taking? Authorizing Provider  acetaminophen-codeine  (TYLENOL #3) 300-30 MG tablet Take 1-2 tablets by mouth every 6 (six) hours as needed for moderate pain or severe pain. 01/07/21   Margarita Mail, PA-C  amoxicillin (AMOXIL) 500 MG capsule Take 1 capsule (500 mg total) by mouth 3 (three) times daily. 01/07/21   Margarita Mail, PA-C  amoxicillin-clavulanate (AUGMENTIN) 875-125 MG tablet Take 1 tablet by mouth every 12 (twelve) hours. 07/23/21   Hazel Sams, PA-C  cyclobenzaprine (FLEXERIL) 10 MG tablet Take 1 tablet (10 mg total) by mouth 3 (three) times daily as needed for muscle spasms. 11/05/20   Orpah Greek, MD  Glucagon 3 MG/DOSE POWD Place 3 mg into the nose once as needed for up to 1 dose. 08/23/20   Philemon Kingdom, MD  Glucagon, rDNA, (GLUCAGON EMERGENCY) 1 MG KIT Please inject im as needed for hypoglycemia 08/23/20   Philemon Kingdom, MD  ibuprofen (ADVIL) 800 MG tablet Take 1 tablet (800 mg total) by mouth every 6 (six) hours as needed for moderate pain. 11/05/20   Orpah Greek, MD  insulin degludec (TRESIBA FLEXTOUCH) 100 UNIT/ML FlexTouch Pen Use as directed 08/23/20   Philemon Kingdom, MD  Insulin Lispro-aabc, 1 U Dial, (LYUMJEV KWIKPEN) 100 UNIT/ML SOPN Use as directed 08/23/20   Philemon Kingdom, MD  Insulin Pen Needle 32G X 4 MM MISC Use up to 5x a day as advised 08/24/20   Philemon Kingdom, MD  ketorolac (TORADOL) 10 MG tablet Take 1 tablet (10 mg total) by mouth every 6 (six) hours as needed. 07/23/21   Hazel Sams, PA-C  lidocaine (XYLOCAINE) 2 % solution Use as directed 15 mLs in the mouth or throat as needed for mouth pain. Or can apply topically with q-tip/cotton ball. 09/16/18   Larene Pickett, PA-C  lidocaine (XYLOCAINE) 2 % solution Use as directed 10 mLs in the mouth or throat as needed for mouth pain. 05/03/21   Volney American, PA-C  magic mouthwash w/lidocaine SOLN Take 10 mLs by mouth 3 (three) times daily. 12/09/18   Marney Setting, NP  methocarbamol (ROBAXIN) 500 MG tablet Take 1  tablet (500 mg total) by mouth 2 (two) times daily. 04/06/21   Tedd Sias, PA    Allergies    Patient has no known allergies.  Review of Systems   Review of Systems  Constitutional:  Positive for fatigue. Negative for chills, diaphoresis and fever.  HENT:  Negative for congestion, rhinorrhea and sneezing.   Eyes: Negative.   Respiratory:  Negative for cough, chest tightness and shortness of breath.   Cardiovascular:  Positive for chest pain and leg swelling.  Gastrointestinal:  Negative for abdominal pain, blood in stool, diarrhea, nausea and vomiting.  Genitourinary:  Negative for difficulty urinating, flank pain, frequency and hematuria.  Musculoskeletal:  Positive for arthralgias, back pain and myalgias. Negative for joint swelling.  Skin:  Negative for rash and wound.  Neurological:  Positive for numbness (Tingling to hands and feet). Negative for dizziness, speech difficulty, weakness and headaches.   Physical Exam Updated Vital Signs BP 128/83 (BP Location: Left Arm)   Pulse 84   Temp 97.6 F (36.4 C) (Oral)   Resp 16   Ht '5\' 8"'  (1.727 m)   Wt 79.4 kg   SpO2 95%   BMI 26.61 kg/m   Physical Exam Constitutional:      Appearance: He is well-developed.  HENT:     Head: Normocephalic and atraumatic.  Eyes:     Pupils: Pupils are equal, round, and reactive to light.  Cardiovascular:     Rate and Rhythm: Normal rate and regular rhythm.     Heart sounds: Normal heart sounds.  Pulmonary:     Effort: Pulmonary effort is normal. No respiratory distress.     Breath sounds: Normal breath sounds. No wheezing or rales.  Chest:     Chest wall: No tenderness.  Abdominal:     General: Bowel sounds are normal.     Palpations: Abdomen is soft.     Tenderness: There is no abdominal tenderness. There is no guarding or rebound.  Musculoskeletal:        General: Normal range of motion.     Cervical back: Normal range of motion and neck supple.  Lymphadenopathy:     Cervical:  No cervical adenopathy.  Skin:    General: Skin is warm and dry.     Findings: No rash.  Neurological:     Mental Status: He is alert and oriented to person, place, and time.     Comments: Motor 5/5 all extremities Sensation grossly intact to LT all extremities Finger to Nose intact, no pronator drift CN II-XII grossly intact Gait normal     ED Results / Procedures / Treatments   Labs (all labs ordered are  listed, but only abnormal results are displayed) Labs Reviewed  COMPREHENSIVE METABOLIC PANEL - Abnormal; Notable for the following components:      Result Value   Glucose, Bld 52 (*)    All other components within normal limits  CBG MONITORING, ED - Abnormal; Notable for the following components:   Glucose-Capillary 62 (*)    All other components within normal limits  CBG MONITORING, ED - Abnormal; Notable for the following components:   Glucose-Capillary 63 (*)    All other components within normal limits  CBG MONITORING, ED - Abnormal; Notable for the following components:   Glucose-Capillary 108 (*)    All other components within normal limits  URINALYSIS, ROUTINE W REFLEX MICROSCOPIC  LIPASE, BLOOD  CBC WITH DIFFERENTIAL/PLATELET    EKG EKG Interpretation  Date/Time:  Monday September 04 2021 17:41:22 EST Ventricular Rate:  78 PR Interval:  161 QRS Duration: 97 QT Interval:  371 QTC Calculation: 423 R Axis:   30 Text Interpretation: Sinus rhythm Borderline T wave abnormalities since last tracing no significant change Confirmed by Malvin Johns [phone]) on 09/04/2021 6:21:29 PM  Radiology No results found.  Procedures Procedures   Medications Ordered in ED Medications - No data to display  ED Course  I have reviewed the triage vital signs and the nursing notes.  Pertinent labs & imaging results that were available during my care of the patient were reviewed by me and considered in my medical decision making (see chart for details).    MDM  Rules/Calculators/A&P                           Patient is a 30 year old male who presents with muscle pain and joint pain all over.  He also has some tingling in his hands and his feet.  He is well-appearing today.  The symptoms have been going on for over a year.  Notes knee joint swelling.  He is neurologically intact.  His labs are nonconcerning.  He needs a primary care physician to further evaluate his joint pains and better manage his diabetes.  He is currently using insulin 70/30.  His glucose was low here.  He was given something to eat and drink and had improved.  He does not want to stay for any further observation with this.  He said that he will keep an eye on it at home although he says he does not have glucose testing strips.  He is refusing to stay for any further observation.  I did speak with case management who has arranged for the patient to follow-up with the [facility] health internal medicine clinic.  Likely he can have an appointment within the next 2 weeks.  He will have to call tomorrow for an appointment.  This was relayed to the patient.  Return precautions were given. Final Clinical Impression(s) / ED Diagnoses Final diagnoses:  Musculoskeletal pain    Rx / DC Orders ED Discharge Orders     None        Malvin Johns, MD 09/04/21 1857

## 2021-09-04 NOTE — ED Triage Notes (Signed)
Patient has multiple complaints. Patient c/o tingling of his fingers, toes, joint pain, episodes of SOB, insomnia,swelling of feet and hands for the past few months.

## 2021-09-06 ENCOUNTER — Telehealth: Payer: Self-pay | Admitting: Family

## 2021-09-06 DIAGNOSIS — K047 Periapical abscess without sinus: Secondary | ICD-10-CM

## 2021-09-06 MED ORDER — IBUPROFEN 600 MG PO TABS
600.0000 mg | ORAL_TABLET | Freq: Three times a day (TID) | ORAL | 0 refills | Status: DC | PRN
Start: 2021-09-06 — End: 2022-03-14

## 2021-09-06 MED ORDER — AMOXICILLIN-POT CLAVULANATE 875-125 MG PO TABS: 1.0000 | ORAL_TABLET | Freq: Two times a day (BID) | ORAL | 0 refills | Status: DC

## 2021-09-06 NOTE — Progress Notes (Signed)
Virtual Visit Consent   Alan Burke, you are scheduled for a virtual visit with a [facility] provider today.     Just as with appointments in the office, your consent must be obtained to participate.  Your consent will be active for this visit and any virtual visit you may have with one of our providers in the next 365 days.     If you have a MyChart account, a copy of this consent can be sent to you electronically.  All virtual visits are billed to your insurance company just like a traditional visit in the office.    As this is a virtual visit, video technology does not allow for your provider to perform a traditional examination.  This may limit your provider's ability to fully assess your condition.  If your provider identifies any concerns that need to be evaluated in person or the need to arrange testing (such as labs, EKG, etc.), we will make arrangements to do so.     Although advances in technology are sophisticated, we cannot ensure that it will always work on either your end or our end.  If the connection with a video visit is poor, the visit may have to be switched to a telephone visit.  With either a video or telephone visit, we are not always able to ensure that we have a secure connection.     I need to obtain your verbal consent now.   Are you willing to proceed with your visit today?    Alan Burke has provided verbal consent on 09/06/2021 for a virtual visit (video or telephone).   Evelina Dun, FNP   Date: 09/06/2021 2:38 PM   Virtual Visit via Video Note   I, Evelina Dun, connected with  Alan Burke  ([id number], [birth year]) on 09/06/21 at  2:30 PM EST by a video-enabled telemedicine application and verified that I am speaking with the correct person using two identifiers.  Location: Patient: Virtual Visit Location Patient: Home Provider: Virtual Visit Location Provider: Home Office   I discussed the limitations of evaluation and management by telemedicine  and the availability of in person appointments. The patient expressed understanding and agreed to proceed.    History of Present Illness: Alan Burke is a 30 y.o. who identifies as a male who was assigned male at birth, and is being seen today for abscess tooth on his right upper tooth. He went to the dentist two months ago and was going to get this one pulled and could not get the other pulled until his infection was gone. He reports his pain is a 10 out 10. He has been taking NSAID' with mild relief. Denies any fever or discharge. Reports he has pain with eating and talking.   HPI: HPI  Problems: There are no problems to display for this patient.   Allergies: No Known Allergies Medications:  Current Outpatient Medications:    amoxicillin-clavulanate (AUGMENTIN) 875-125 MG tablet, Take 1 tablet by mouth 2 (two) times daily., Disp: 14 tablet, Rfl: 0   ibuprofen (ADVIL) 600 MG tablet, Take 1 tablet (600 mg total) by mouth every 8 (eight) hours as needed., Disp: 30 tablet, Rfl: 0   Glucagon 3 MG/DOSE POWD, Place 3 mg into the nose once as needed for up to 1 dose., Disp: 1 each, Rfl: 11   Glucagon, rDNA, (GLUCAGON EMERGENCY) 1 MG KIT, Please inject im as needed for hypoglycemia, Disp: 1 kit, Rfl: 11   insulin degludec (TRESIBA FLEXTOUCH) 100 UNIT/ML FlexTouch  Pen, Use as directed, Disp: 100 mL, Rfl: 0   Insulin Lispro-aabc, 1 U Dial, (LYUMJEV KWIKPEN) 100 UNIT/ML SOPN, Use as directed, Disp: 100 mL, Rfl: 0   Insulin Pen Needle 32G X 4 MM MISC, Use up to 5x a day as advised, Disp: 400 each, Rfl: 3  Observations/Objective: Patient is well-developed, well-nourished in no acute distress.  Resting comfortably  at home.  Head is normocephalic, atraumatic.  No labored breathing.  Speech is clear and coherent with logical content.  Patient is alert and oriented at baseline.    Assessment and Plan: 1. Tooth abscess - amoxicillin-clavulanate (AUGMENTIN) 875-125 MG tablet; Take 1 tablet by mouth 2  (two) times daily.  Dispense: 14 tablet; Refill: 0 - ibuprofen (ADVIL) 600 MG tablet; Take 1 tablet (600 mg total) by mouth every 8 (eight) hours as needed.  Dispense: 30 tablet; Refill: 0 Keep follow up with dentist  Motrin every 8 hours Rinse mouth after meals   Follow Up Instructions: I discussed the assessment and treatment plan with the patient. The patient was provided an opportunity to ask questions and all were answered. The patient agreed with the plan and demonstrated an understanding of the instructions.  A copy of instructions were sent to the patient via MyChart unless otherwise noted below.     The patient was advised to call back or seek an in-person evaluation if the symptoms worsen or if the condition fails to improve as anticipated.  Time:  I spent 14 minutes with the patient via telehealth technology discussing the above problems/concerns.    Evelina Dun, FNP

## 2021-09-06 NOTE — Patient Instructions (Signed)
Dental Abscess ?A dental abscess is an infection around a tooth that may involve pain, swelling, and a collection of pus, as well as other symptoms. Treatment is important to help with symptoms and to prevent the infection from spreading. ?The general types of dental abscesses are: ?Pulpal abscess. This abscess may form from the inner part of the tooth (pulp). ?Periodontal abscess. This abscess may form from the gum. ?What are the causes? ?This condition is caused by a bacterial infection in or around the tooth. It may result from: ?Severe tooth decay (cavities). ?Trauma to the tooth, such as a broken or chipped tooth. ?What increases the risk? ?This condition is more likely to develop in males. It is also more likely to develop in people who: ?Have cavities. ?Have severe gum disease. ?Eat sugary snacks between meals. ?Use tobacco products. ?Have diabetes. ?Have a weakened disease-fighting system (immune system). ?Do not brush and care for their teeth regularly. ?What are the signs or symptoms? ?Mild symptoms of this condition include: ?Tenderness. ?Bad breath. ?Fever. ?A bitter taste in the mouth. ?Pain in and around the infected tooth. ?Moderate symptoms of this condition include: ?Swollen neck glands. ?Chills. ?Pus drainage. ?Swelling and redness around the infected tooth, in the mouth, or in the face. ?Severe pain in and around the infected tooth. ?Severe symptoms of this condition include: ?Difficulty swallowing. ?Difficulty opening the mouth. ?Nausea. ?Vomiting. ?How is this diagnosed? ?This condition is diagnosed based on: ?Your symptoms and your medical and dental history. ?An examination of the infected tooth. During the exam, your dental care provider may tap on the infected tooth. ?You may also need to have X-rays taken of the affected area. ?How is this treated? ?This condition is treated by getting rid of the infection. This may be done with: ?Antibiotic medicines. These may be used in certain  situations. ?Antibacterial mouth rinse. ?Incision and drainage. This procedure is done by making an incision in the abscess to drain out the pus. Removing pus is the first priority in treating an abscess. ?A root canal. This may be performed to save the tooth. Your dental care provider accesses the visible part of your tooth (crown) with a drill and removes any infected pulp. Then the space is filled and sealed off. ?Tooth extraction. The tooth is pulled out if it cannot be saved by other treatment. ?You may also receive treatment for pain, such as: ?Acetaminophen or NSAIDs. ?Gels that contain a numbing medicine. ?An injection to block the pain near your nerve. ?Follow these instructions at home: ?Medicines ?Take over-the-counter and prescription medicines only as told by your dental care provider. ?If you were prescribed an antibiotic, take it as told by your dental care provider. Do not stop taking the antibiotic even if you start to feel better. ?If you were prescribed a gel that contains a numbing medicine, use it exactly as told in the directions. Do not use these gels for children who are younger than 2 years of age. ?Use an antibacterial mouth rinse as told by your dental care provider. ?General instructions ? ?Gargle with a mixture of salt and water 3-4 times a day or as needed. To make salt water, completely dissolve ?-1 tsp (3-6 g) of salt in 1 cup (237 mL) of warm water. ?Eat a soft diet while your abscess is healing. ?Drink enough fluid to keep your urine pale yellow. ?Do not apply heat to the outside of your mouth. ?Do not use any products that contain nicotine or tobacco. These products   include cigarettes, chewing tobacco, and vaping devices, such as e-cigarettes. If you need help quitting, ask your dental care provider. ?Keep all follow-up visits. This is important. ?How is this prevented? ? ?Excellent dental home care, which includes brushing your teeth every morning and night with fluoride  toothpaste. Floss one time each day. ?Get regularly scheduled dental cleanings. ?Consider having a dental sealant applied on teeth that have deep grooves to prevent cavities. ?Drink fluoridated water regularly. This includes most tap water. Check the label on bottled water to see if it contains fluoride. ?Reduce or eliminate sugary drinks. ?Eat healthy meals and snacks. ?Wear a mouth guard or face shield to protect your teeth while playing sports. ?Contact a health care provider if: ?Your pain is worse and is not helped by medicine. ?You have swelling. ?You see pus around the tooth. ?You have a fever or chills. ?Get help right away if: ?Your symptoms suddenly get worse. ?You have a very bad headache. ?You have problems breathing or swallowing. ?You have trouble opening your mouth. ?You have swelling in your neck or around your eye. ?These symptoms may represent a serious problem that is an emergency. Do not wait to see if the symptoms will go away. Get medical help right away. Call your local emergency services (911 in the [place] Do not drive yourself to the hospital. ?Summary ?A dental abscess is a collection of pus in or around a tooth that results from an infection. ?A dental abscess may result from severe tooth decay, trauma to the tooth, or severe gum disease around a tooth. ?Symptoms include severe pain, swelling, redness, and drainage of pus in and around the infected tooth. ?The first priority in treating a dental abscess is to drain out the pus. Treatment may also involve removing damage inside the tooth (root canal) or extracting the tooth. ?This information is not intended to replace advice given to you by your health care provider. Make sure you discuss any questions you have with your health care provider. ?Document Revised: 12/08/2020 Document Reviewed: 12/08/2020 ?Elsevier Patient Education ? 2022 Elsevier Inc[employer] ? ?

## 2021-09-13 ENCOUNTER — Other Ambulatory Visit: Payer: Self-pay

## 2021-09-13 ENCOUNTER — Encounter (HOSPITAL_COMMUNITY): Payer: Self-pay | Admitting: Emergency Medicine

## 2021-09-13 ENCOUNTER — Ambulatory Visit (HOSPITAL_COMMUNITY)
Admission: EM | Admit: 2021-09-13 | Discharge: 2021-09-13 | Disposition: A | Discharge status: Home / Self Care | Payer: Medicaid Other | Attending: Urgent Care | Admitting: Urgent Care

## 2021-09-13 DIAGNOSIS — E1140 Type 2 diabetes mellitus with diabetic neuropathy, unspecified: Secondary | ICD-10-CM

## 2021-09-13 DIAGNOSIS — M792 Neuralgia and neuritis, unspecified: Secondary | ICD-10-CM

## 2021-09-13 DIAGNOSIS — Q386 Other congenital malformations of mouth: Secondary | ICD-10-CM

## 2021-09-13 DIAGNOSIS — Q5569 Other congenital malformation of penis: Secondary | ICD-10-CM

## 2021-09-13 DIAGNOSIS — M79672 Pain in left foot: Secondary | ICD-10-CM

## 2021-09-13 DIAGNOSIS — Z8639 Personal history of other endocrine, nutritional and metabolic disease: Secondary | ICD-10-CM

## 2021-09-13 MED ORDER — GABAPENTIN 300 MG PO CAPS: 300.0000 mg | ORAL_CAPSULE | Freq: Two times a day (BID) | ORAL | 0 refills | Status: DC

## 2021-09-13 NOTE — ED Triage Notes (Signed)
Pt doesn't have insurance or PCP currently so taking no prescription medications for diabetes at this time.

## 2021-09-13 NOTE — ED Provider Notes (Signed)
[facility]   MRN: [id number] DOB: 1990-11-09  Subjective:   Alan Burke is a 30 y.o. male presenting for several day history of recurrent non tender bumps over the shaft of his penis. Denies dysuria, hematuria, urinary frequency, penile discharge, penile swelling, testicular pain, testicular swelling, anal pain, groin pain. Also has had left foot burning, tingling, shock type sensations of his left foot. Radiates up to the calf. No trauma, injury. Patient has diabetes, currently not on any medications. Is trying to establish care.    No current facility-administered medications for this encounter.  Current Outpatient Medications:    amoxicillin-clavulanate (AUGMENTIN) 875-125 MG tablet, Take 1 tablet by mouth 2 (two) times daily., Disp: 14 tablet, Rfl: 0   Glucagon 3 MG/DOSE POWD, Place 3 mg into the nose once as needed for up to 1 dose., Disp: 1 each, Rfl: 11   Glucagon, rDNA, (GLUCAGON EMERGENCY) 1 MG KIT, Please inject im as needed for hypoglycemia, Disp: 1 kit, Rfl: 11   ibuprofen (ADVIL) 600 MG tablet, Take 1 tablet (600 mg total) by mouth every 8 (eight) hours as needed., Disp: 30 tablet, Rfl: 0   insulin degludec (TRESIBA FLEXTOUCH) 100 UNIT/ML FlexTouch Pen, Use as directed, Disp: 100 mL, Rfl: 0   Insulin Lispro-aabc, 1 U Dial, (LYUMJEV KWIKPEN) 100 UNIT/ML SOPN, Use as directed, Disp: 100 mL, Rfl: 0   Insulin Pen Needle 32G X 4 MM MISC, Use up to 5x a day as advised, Disp: 400 each, Rfl: 3   No Known Allergies  Past Medical History:  Diagnosis Date   Diabetes mellitus without complication ([place])      Past Surgical History:  Procedure Laterality Date   HAND SURGERY      Family History  Problem Relation Age of Onset   Heart disease Paternal Grandfather     Social History   Tobacco Use   Smoking status: Never   Smokeless tobacco: Never  Vaping Use   Vaping Use: Never used  Substance Use Topics   Alcohol use: Never   Drug use: No     ROS   Objective:   Vitals: BP 137/76 (BP Location: Right Arm)   Pulse 87   Temp 99.3 F (37.4 C) (Oral)   Resp 16   SpO2 96%   Physical Exam Constitutional:      General: He is not in acute distress.    Appearance: Normal appearance. He is well-developed and normal weight. He is not ill-appearing, toxic-appearing or diaphoretic.  HENT:     Head: Normocephalic and atraumatic.     Right Ear: External ear normal.     Left Ear: External ear normal.     Nose: Nose normal.  Eyes:     General: No scleral icterus.       Right eye: No discharge.        Left eye: No discharge.     Extraocular Movements: Extraocular movements intact.     Pupils: Pupils are equal, round, and reactive to light.  Cardiovascular:     Rate and Rhythm: Normal rate.  Pulmonary:     Effort: Pulmonary effort is normal.  Genitourinary:    Penis: Lesions (fordyce spots about the shaft of the penis) present. No phimosis, paraphimosis, hypospadias, erythema, tenderness, discharge or swelling.   Musculoskeletal:     Cervical back: Normal range of motion.     Left foot: Normal range of motion and normal capillary refill. No swelling, deformity, bunion, prominent metatarsal heads, laceration,  tenderness, bony tenderness or crepitus.  Neurological:     Mental Status: He is alert and oriented to person, place, and time.  Psychiatric:        Mood and Affect: Mood normal.        Behavior: Behavior normal.        Thought Content: Thought content normal.        Judgment: Judgment normal.     Assessment and Plan :   PDMP not reviewed this encounter.  1. Foot pain, left   2. Fordyce spots   3. History of diabetes mellitus   4. Neuropathic pain    Offered patient gabapentin for neuropathic type pain.  Recommended he continue efforts at establishing care with new PCP and provided him with information for this.  Lesions of the penis are very reassuring as they appear to be Fordyce spots.  Have no appearance  of genital herpes. Counseled patient on potential for adverse effects with medications prescribed/recommended today, ER and return-to-clinic precautions discussed, patient verbalized understanding.    Jaynee Eagles, [place] 09/14/21 [phone]

## 2021-09-13 NOTE — ED Triage Notes (Signed)
Pt reports has bumps on penis for a few days. Pt reports irritation with intercourse.   Pt reports is diabetic and concerned for neuropathy due to left foot pain for year that has been intermittent.

## 2021-09-13 NOTE — Discharge Instructions (Addendum)
The spots on the penis appear to be fordyce spots which are benign, not dangerous. Certainly does not look like herpes, is not infectious. However, I do recommend safe sex practices.

## 2021-11-07 ENCOUNTER — Ambulatory Visit (HOSPITAL_COMMUNITY)
Admission: EM | Admit: 2021-11-07 | Discharge: 2021-11-07 | Disposition: A | Discharge status: Home / Self Care | Payer: Self-pay | Attending: Physician Assistant | Admitting: Physician Assistant

## 2021-11-07 ENCOUNTER — Other Ambulatory Visit: Payer: Self-pay

## 2021-11-07 ENCOUNTER — Encounter (HOSPITAL_COMMUNITY): Payer: Self-pay

## 2021-11-07 DIAGNOSIS — E10638 Type 1 diabetes mellitus with other oral complications: Secondary | ICD-10-CM

## 2021-11-07 DIAGNOSIS — K047 Periapical abscess without sinus: Secondary | ICD-10-CM

## 2021-11-07 DIAGNOSIS — K0889 Other specified disorders of teeth and supporting structures: Secondary | ICD-10-CM

## 2021-11-07 DIAGNOSIS — E1042 Type 1 diabetes mellitus with diabetic polyneuropathy: Secondary | ICD-10-CM

## 2021-11-07 MED ORDER — CHLORHEXIDINE GLUCONATE 0.12 % MT SOLN: 15.0000 mL | Freq: Two times a day (BID) | OROMUCOSAL | 0 refills | Status: DC

## 2021-11-07 MED ORDER — GABAPENTIN 300 MG PO CAPS: 300.0000 mg | ORAL_CAPSULE | Freq: Two times a day (BID) | ORAL | 1 refills | Status: DC

## 2021-11-07 MED ORDER — AMOXICILLIN-POT CLAVULANATE 875-125 MG PO TABS: 1.0000 | ORAL_TABLET | Freq: Two times a day (BID) | ORAL | 0 refills | Status: DC

## 2021-11-07 NOTE — ED Provider Notes (Signed)
[place]    CSN: [id number] Arrival date & time: 11/07/21  1932      History   Chief Complaint Chief Complaint  Patient presents with   Dental Pain   Medication Refill    HPI Alan Burke is a 31 y.o. male.   Patient presents today with a 2-week history of recurrent dental pain.  He has a history of dental abnormalities and has had multiple extractions.  He is followed by the dental school but was unable to get an appointment to address current symptoms.  He has been alternating Tylenol ibuprofen without improvement of symptoms.  He reports pain has increased and is currently 10 on a 0-10 pain scale, localized to affected areas, described as throbbing, worse with mastication or exposure to temperature extremes, no relieving factors identified.  He denies any difficulty speaking, difficulty swallowing, voice change, swelling of his throat.  Denies any recent antibiotics.  He does have a history of uncontrolled type 1 diabetes and is prone to dental infections with similar symptoms.  In addition, patient is requesting refill of gabapentin to manage neuropathy.  He has a history of type 1 diabetes and is currently managing this with 7030 mix insulin as he cannot afford long-acting and short acting insulins.  He is in the process of establishing health insurance and intends to follow-up with endocrinology as soon as he is able.  He is monitoring his blood sugar more regularly and trying to use dietary modification including limiting carbohydrates for treatment.  He denies any symptomatic hyper or hypoglycemia, nausea, vomiting, shortness of breath, lethargy.   Past Medical History:  Diagnosis Date   Diabetes mellitus without complication ([place])     There are no problems to display for this patient.   Past Surgical History:  Procedure Laterality Date   HAND SURGERY         Home Medications    Prior to Admission medications   Medication Sig Start Date End Date  Taking? Authorizing Provider  amoxicillin-clavulanate (AUGMENTIN) 875-125 MG tablet Take 1 tablet by mouth every 12 (twelve) hours. 11/07/21  Yes Ashish Rossetti K, PA-C  chlorhexidine (PERIDEX) 0.12 % solution Use as directed 15 mLs in the mouth or throat 2 (two) times daily. 11/07/21  Yes Krystian Ferrentino K, PA-C  gabapentin (NEURONTIN) 300 MG capsule Take 1 capsule (300 mg total) by mouth 2 (two) times daily. 11/07/21   Javarius Tsosie, Derry Skill, PA-C  Glucagon 3 MG/DOSE POWD Place 3 mg into the nose once as needed for up to 1 dose. 08/23/20   Philemon Kingdom, MD  Glucagon, rDNA, (GLUCAGON EMERGENCY) 1 MG KIT Please inject im as needed for hypoglycemia 08/23/20   Philemon Kingdom, MD  ibuprofen (ADVIL) 600 MG tablet Take 1 tablet (600 mg total) by mouth every 8 (eight) hours as needed. 09/06/21   Sharion Balloon, FNP  insulin degludec (TRESIBA FLEXTOUCH) 100 UNIT/ML FlexTouch Pen Use as directed 08/23/20   Philemon Kingdom, MD  Insulin Lispro-aabc, 1 U Dial, (LYUMJEV KWIKPEN) 100 UNIT/ML SOPN Use as directed 08/23/20   Philemon Kingdom, MD  Insulin Pen Needle 32G X 4 MM MISC Use up to 5x a day as advised 08/24/20   Philemon Kingdom, MD    Family History Family History  Problem Relation Age of Onset   Heart disease Paternal Grandfather     Social History Social History   Tobacco Use   Smoking status: Never   Smokeless tobacco: Never  Vaping Use   Vaping Use:  Never used  Substance Use Topics   Alcohol use: Never   Drug use: No     Allergies   Patient has no known allergies.   Review of Systems Review of Systems  Constitutional:  Positive for activity change. Negative for appetite change, fatigue and fever.  HENT:  Positive for dental problem. Negative for congestion, sinus pressure, sneezing and sore throat.   Eyes:  Negative for visual disturbance.  Respiratory:  Negative for cough and shortness of breath.   Cardiovascular:  Negative for chest pain.  Gastrointestinal:  Negative for  abdominal pain, diarrhea, nausea and vomiting.  Neurological:  Positive for numbness (feet and hands). Negative for dizziness, weakness, light-headedness and headaches.    Physical Exam Triage Vital Signs ED Triage Vitals  Enc Vitals Group     BP 11/07/21 1948 139/81     Pulse Rate 11/07/21 1948 86     Resp 11/07/21 1948 18     Temp 11/07/21 1948 98.7 F (37.1 C)     Temp Source 11/07/21 1948 Oral     SpO2 11/07/21 1948 94 %     Weight --      Height --      Head Circumference --      Peak Flow --      Pain Score 11/07/21 1949 10     Pain Loc --      Pain Edu? --      Excl. in [place]? --    No data found.  Updated Vital Signs BP 139/81 (BP Location: Left Arm)    Pulse 86    Temp 98.7 F (37.1 C) (Oral)    Resp 18    SpO2 94%   Visual Acuity Right Eye Distance:   Left Eye Distance:   Bilateral Distance:    Right Eye Near:   Left Eye Near:    Bilateral Near:     Physical Exam Vitals reviewed.  Constitutional:      General: He is awake.     Appearance: Normal appearance. He is well-developed. He is not ill-appearing.     Comments: Very pleasant male appears stated age in no acute distress sitting comfortably in exam room  HENT:     Head: Normocephalic and atraumatic.     Right Ear: External ear normal.     Left Ear: External ear normal.     Nose: Nose normal.     Mouth/Throat:     Mouth: Mucous membranes are moist.     Dentition: Abnormal dentition. Dental tenderness, gingival swelling and dental abscesses present.     Pharynx: Uvula midline. No oropharyngeal exudate or posterior oropharyngeal erythema.      Comments: No evidence of Ludwig angina Cardiovascular:     Rate and Rhythm: Normal rate and regular rhythm.     Heart sounds: Normal heart sounds, S1 normal and S2 normal. No murmur heard. Pulmonary:     Effort: Pulmonary effort is normal. No accessory muscle usage or respiratory distress.     Breath sounds: Normal breath sounds. No stridor. No wheezing,  rhonchi or rales.     Comments: Clear to auscultation bilaterally Neurological:     Mental Status: He is alert.  Psychiatric:        Behavior: Behavior is cooperative.     UC Treatments / Results  Labs (all labs ordered are listed, but only abnormal results are displayed) Labs Reviewed - No data to display  EKG   Radiology No results found.  Procedures  Procedures (including critical care time)  Medications Ordered in UC Medications - No data to display  Initial Impression / Assessment and Plan / UC Course  I have reviewed the triage vital signs and the nursing notes.  Pertinent labs & imaging results that were available during my care of the patient were reviewed by me and considered in my medical decision making (see chart for details).     Dental infection noted on exam.  Patient was started on Augmentin twice daily for 7 days.  Discussed the importance of following up with a dentist as he will likely have recurrent symptoms until underlying tooth issue is addressed.  Patient reports he will contact his dentist (d[facility] school) to schedule an appointment as soon as possible.  He can alternate Tylenol ibuprofen for pain.  He was prescribed chlorhexidine mouthwash to help decrease mouth bacterial load with the hope of decreasing frequency of dental infections.  Discussed that if he has any worsening symptoms including difficulty swallowing, shortness of breath, swelling of his throat, muffled voice, high fever not responding to medication he needs to go to the emergency room.  Patient has a history of type 1 diabetes with frequent hyperglycemia and associated polyneuropathy as complication.  He does not have the financial resources or health insurance to establish with endocrinology for medication adjustment at this time.  Encouraged him to continue applying for assistance including with pharmaceutical companies as he may qualify for financial assistance and be given this medication  for free.  If he is able to secure short and long-acting insulins he is to follow-up with endocrinology or if unable to see them return here to help with medication adjustment.  He was provided a refill of gabapentin.  As this can be sedating and he is not to drive or drink alcohol with it.  Discussed that the importance of adequate glycemic control.  Patient denies any concerning signs or symptoms and declined additional testing today.  She will encourage him to follow-up with endocrinology as soon as possible.  Discussed that if he has any alarm symptoms including symptomatic hyper or hypoglycemia, nausea/vomiting interfering with oral intake, shortness of breath, weakness, lethargy he needs to go to the emergency room.  Strict return precautions given to which he expressed understanding.  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  Follow-up with dentist as we discussed.  If you have any difficulty swallowing, swelling of your throat, shortness of breath you need to go to the emergency room.  Alternate Tylenol ibuprofen for pain.  Use chlorhexidine mouthwash to help decrease bacterial load and decrease the risk of infection in the future.  Start gabapentin 300 mg twice daily as previously prescribed.  This can make you sleepy so do not drive or drink alcohol taking it.  It is very important that you continue taking your insulin.  Once you get insurance please follow-up with either our clinic or your endocrinologist.  Monitor your blood sugar at home.  If you have high blood sugar, recurrent episodes of low blood sugar, nausea/vomiting, shortness of breath you need to be seen immediately.     ED Prescriptions     Medication Sig Dispense Auth. Provider   gabapentin (NEURONTIN) 300 MG capsule Take 1 capsule  (300 mg total) by mouth 2 (two)  times daily. 60 capsule Naseer Hearn K, PA-C   amoxicillin-clavulanate (AUGMENTIN) 875-125 MG tablet Take 1 tablet by mouth every 12 (twelve) hours. 14 tablet Audray Rumore K, PA-C   chlorhexidine (PERIDEX) 0.12 % solution Use as directed 15 mLs in the mouth or throat 2 (two) times daily. 120 mL Jordann Grime K, PA-C      PDMP not reviewed this encounter.   Terrilee Croak, PA-C 11/07/21 2027

## 2021-11-07 NOTE — ED Triage Notes (Signed)
Pt c/o rt upper dental pain for over 2wks. States taking tylenol and ibuprofen with no relief. States needs a refill on his gabapentin.

## 2021-11-07 NOTE — Discharge Instructions (Addendum)
Take Augmentin twice daily for 7 days.  Follow-up with dentist as we discussed.  If you have any difficulty swallowing, swelling of your throat, shortness of breath you need to go to the emergency room.  Alternate Tylenol ibuprofen for pain.  Use chlorhexidine mouthwash to help decrease bacterial load and decrease the risk of infection in the future.  Start gabapentin 300 mg twice daily as previously prescribed.  This can make you sleepy so do not drive or drink alcohol taking it.  It is very important that you continue taking your insulin.  Once you get insurance please follow-up with either our clinic or your endocrinologist.  Monitor your blood sugar at home.  If you have high blood sugar, recurrent episodes of low blood sugar, nausea/vomiting, shortness of breath you need to be seen immediately.

## 2022-03-13 ENCOUNTER — Other Ambulatory Visit: Payer: Self-pay

## 2022-03-13 ENCOUNTER — Encounter (HOSPITAL_COMMUNITY): Payer: Self-pay | Admitting: Emergency Medicine

## 2022-03-13 ENCOUNTER — Emergency Department (HOSPITAL_COMMUNITY)
Admission: EM | Admit: 2022-03-13 | Discharge: 2022-03-14 | Disposition: A | Discharge status: Home / Self Care | Payer: Self-pay | Attending: Student | Admitting: Student

## 2022-03-13 DIAGNOSIS — R739 Hyperglycemia, unspecified: Secondary | ICD-10-CM

## 2022-03-13 DIAGNOSIS — E10319 Type 1 diabetes mellitus with unspecified diabetic retinopathy without macular edema: Secondary | ICD-10-CM | POA: Insufficient documentation

## 2022-03-13 DIAGNOSIS — E1065 Type 1 diabetes mellitus with hyperglycemia: Secondary | ICD-10-CM | POA: Insufficient documentation

## 2022-03-13 DIAGNOSIS — G629 Polyneuropathy, unspecified: Secondary | ICD-10-CM

## 2022-03-13 DIAGNOSIS — Z794 Long term (current) use of insulin: Secondary | ICD-10-CM | POA: Insufficient documentation

## 2022-03-13 DIAGNOSIS — M2550 Pain in unspecified joint: Secondary | ICD-10-CM

## 2022-03-13 DIAGNOSIS — M2540 Effusion, unspecified joint: Secondary | ICD-10-CM | POA: Insufficient documentation

## 2022-03-13 LAB — CBC WITH DIFFERENTIAL/PLATELET
Abs Immature Granulocytes: 0.01 10*3/uL (ref 0.00–0.07)
Basophils Absolute: 0.1 10*3/uL (ref 0.0–0.1)
Basophils Relative: 1 %
Eosinophils Absolute: 0.1 10*3/uL (ref 0.0–0.5)
Eosinophils Relative: 2 %
HCT: 40 % (ref 39.0–52.0)
Hemoglobin: 13.7 g/dL (ref 13.0–17.0)
Immature Granulocytes: 0 %
Lymphocytes Relative: 35 %
Lymphs Abs: 2.3 10*3/uL (ref 0.7–4.0)
MCH: 29 pg (ref 26.0–34.0)
MCHC: 34.3 g/dL (ref 30.0–36.0)
MCV: 84.7 fL (ref 80.0–100.0)
Monocytes Absolute: 0.5 10*3/uL (ref 0.1–1.0)
Monocytes Relative: 8 %
Neutro Abs: 3.6 10*3/uL (ref 1.7–7.7)
Neutrophils Relative %: 54 %
Platelets: 164 10*3/uL (ref 150–400)
RBC: 4.72 MIL/uL (ref 4.22–5.81)
RDW: 12.7 % (ref 11.5–15.5)
WBC: 6.6 10*3/uL (ref 4.0–10.5)
nRBC: 0 % (ref 0.0–0.2)

## 2022-03-13 LAB — COMPREHENSIVE METABOLIC PANEL
ALT: 22 U/L (ref 0–44)
AST: 28 U/L (ref 15–41)
Albumin: 4 g/dL (ref 3.5–5.0)
Alkaline Phosphatase: 71 U/L (ref 38–126)
Anion gap: 6 (ref 5–15)
BUN: 13 mg/dL (ref 6–20)
CO2: 28 mmol/L (ref 22–32)
Calcium: 9.1 mg/dL (ref 8.9–10.3)
Chloride: 102 mmol/L (ref 98–111)
Creatinine, Ser: 1.02 mg/dL (ref 0.61–1.24)
GFR, Estimated: >60 mL/min (ref >60)
Glucose, Bld: 310 mg/dL — ABNORMAL HIGH (ref 70–99)
Potassium: 4.1 mmol/L (ref 3.5–5.1)
Sodium: 136 mmol/L (ref 135–145)
Total Bilirubin: 0.7 mg/dL (ref 0.3–1.2)
Total Protein: 7.5 g/dL (ref 6.5–8.1)

## 2022-03-13 LAB — CBG MONITORING, ED: Glucose-Capillary: 332 mg/dL — ABNORMAL HIGH (ref 70–99)

## 2022-03-13 MED ORDER — GABAPENTIN 300 MG PO CAPS
300.0000 mg | ORAL_CAPSULE | Freq: Once | ORAL | Status: AC
  Administered 2022-03-13: 300 mg via ORAL
  Filled 2022-03-13: qty 1

## 2022-03-13 NOTE — ED Notes (Signed)
ED Provider at bedside. 

## 2022-03-13 NOTE — ED Triage Notes (Signed)
Pt reports that he has diabetic retinopathy and experiences pain in his arms, legs, and hands. Pt is unable to close his hands fully at this time. Pt's CBG is 332.

## 2022-03-13 NOTE — ED Provider Notes (Signed)
[facility] DEPT Provider Note  CSN: [id number] Arrival date & time: 03/13/22 2231  Chief Complaint(s) diabetic retinopathy and Hyperglycemia  HPI Alan Burke is a 31 y.o. male with PMH type 1 diabetes on insulin who presents emergency room for evaluation of joint pain.  Initial chief complaint states diabetic retinopathy hyperglycemia and the patient does arrive with sugar elevated to 332 denies any chest pain, shortness of breath, abdominal pain, nausea, vomiting or other systemic symptoms.  Patient states that for greater than 1 month he has had intermittent swelling and pain in multiple joints of his lower extremities and hands.  He states that recently his right fourth toe has been swelling and very painful but the joint pain traditionally gets better after moving around.  He denies associated fever, penile discharge, rash or other systemic symptoms.  He states he is also run out of his Neurontin.  Note, patient states he also has significant difficulty with health insurance and his care has been delayed due to difficulty with following up with lack of health insurance.  In regards to the patient's diabetic retinopathy, he states he has not seen an eye doctor due to difficulty with insurance.  He states that he will intermittently wake up with left eye pain and difficulty opening the eye but he currently is not experiencing the symptoms.   Past Medical History Past Medical History:  Diagnosis Date   Diabetes mellitus without complication ([place])    There are no problems to display for this patient.  Home Medication(s) Prior to Admission medications   Medication Sig Start Date End Date Taking? Authorizing Provider  amoxicillin-clavulanate (AUGMENTIN) 875-125 MG tablet Take 1 tablet by mouth every 12 (twelve) hours. 11/07/21   Raspet, Derry Skill, PA-C  chlorhexidine (PERIDEX) 0.12 % solution Use as directed 15 mLs in the mouth or throat 2 (two) times daily.  11/07/21   Raspet, Derry Skill, PA-C  gabapentin (NEURONTIN) 300 MG capsule Take 1 capsule (300 mg total) by mouth 2 (two) times daily. 11/07/21   Raspet, Derry Skill, PA-C  Glucagon 3 MG/DOSE POWD Place 3 mg into the nose once as needed for up to 1 dose. 08/23/20   Philemon Kingdom, MD  Glucagon, rDNA, (GLUCAGON EMERGENCY) 1 MG KIT Please inject im as needed for hypoglycemia 08/23/20   Philemon Kingdom, MD  ibuprofen (ADVIL) 600 MG tablet Take 1 tablet (600 mg total) by mouth every 8 (eight) hours as needed. 09/06/21   Sharion Balloon, FNP  insulin degludec (TRESIBA FLEXTOUCH) 100 UNIT/ML FlexTouch Pen Use as directed 08/23/20   Philemon Kingdom, MD  Insulin Lispro-aabc, 1 U Dial, (LYUMJEV KWIKPEN) 100 UNIT/ML SOPN Use as directed 08/23/20   Philemon Kingdom, MD  Insulin Pen Needle 32G X 4 MM MISC Use up to 5x a day as advised 08/24/20   Philemon Kingdom, MD  Past Surgical History Past Surgical History:  Procedure Laterality Date   HAND SURGERY     Family History Family History  Problem Relation Age of Onset   Heart disease Paternal Grandfather     Social History Social History   Tobacco Use   Smoking status: Never   Smokeless tobacco: Never  Vaping Use   Vaping Use: Never used  Substance Use Topics   Alcohol use: Never   Drug use: No   Allergies Patient has no known allergies.  Review of Systems Review of Systems  Musculoskeletal:  Positive for arthralgias, joint swelling and myalgias.   Physical Exam Vital Signs  I have reviewed the triage vital signs BP 128/81 (BP Location: Left Arm)   Pulse 80   Temp 98.4 F (36.9 C) (Oral)   Resp 18   Ht '5\' 9"'  (1.753 m)   Wt 82.6 kg   SpO2 97%   BMI 26.88 kg/m   Physical Exam Constitutional:      General: He is not in acute distress.    Appearance: Normal appearance.  HENT:     Head: Normocephalic and  atraumatic.     Nose: No congestion or rhinorrhea.  Eyes:     General:        Right eye: No discharge.        Left eye: No discharge.     Extraocular Movements: Extraocular movements intact.     Pupils: Pupils are equal, round, and reactive to light.  Cardiovascular:     Rate and Rhythm: Normal rate and regular rhythm.     Heart sounds: No murmur heard. Pulmonary:     Effort: No respiratory distress.     Breath sounds: No wheezing or rales.  Abdominal:     General: There is no distension.     Tenderness: There is no abdominal tenderness.  Musculoskeletal:        General: Swelling (Mild swelling multiple joints in the toes) and tenderness present. Normal range of motion.     Cervical back: Normal range of motion.  Skin:    General: Skin is warm and dry.  Neurological:     General: No focal deficit present.     Mental Status: He is alert.    ED Results and Treatments Labs (all labs ordered are listed, but only abnormal results are displayed) Labs Reviewed  CBG MONITORING, ED - Abnormal; Notable for the following components:      Result Value   Glucose-Capillary 332 (*)    All other components within normal limits  COMPREHENSIVE METABOLIC PANEL  CBC WITH DIFFERENTIAL/PLATELET                                                                                                                          Radiology No results found.  Pertinent labs & imaging results that were available during my care of the patient were reviewed by me and considered in my medical decision making (see MDM for details).  Medications Ordered in ED Medications - No data to display                                                                                                                                   Procedures Procedures  (including critical care time)  Medical Decision Making / ED Course   This patient presents to the ED for concern of joint swelling, this involves an extensive number  of treatment options, and is a complaint that carries with it a high risk of complications and morbidity.  The differential diagnosis includes diabetic neuropathy, lupus, Reiter's syndrome, disseminated gonococcus, gout, RA  MDM: Seen emergency room for evaluation of joint swelling.  Physical exam reveals very mild joint swelling to multiple joints in the toes and lower extremities.  Minimally tender to palpation but no erythema or obvious gouty tophus.  Laboratory evaluation unremarkable outside of hyperglycemia to 310.  Patient given his Neurontin, Toradol and lactated Ringer's and on reevaluation pain is improved.  An ANA was sent and patient will have to follow this lab outpatient as he likely would benefit from a rheumatologic evaluation given his current autoimmune type 1 diabetes status.  There is a high likelihood that the symptoms are autoimmune in nature but they currently do not meet inpatient criteria for admission and the patient has a follow-up appointment tomorrow.  Thus, patient discharged with outpatient follow-up tomorrow with instructions to follow-up his ANA result on MyChart.  I also placed a consult to [facility] to recheck the patient for assistance with health insurance.  I have overall low suspicion for gout and there are no joints swollen or painful enough to warrant a bedside tap.  Patient with normal bicarb and no evidence of DKA today.   Additional history obtained:  -External records from outside source obtained and reviewed including: Chart review including previous notes, labs, imaging, consultation notes   Lab Tests: -I ordered, reviewed, and interpreted labs.   The pertinent results include:   Labs Reviewed  CBG MONITORING, ED - Abnormal; Notable for the following components:      Result Value   Glucose-Capillary 332 (*)    All other components within normal limits  COMPREHENSIVE METABOLIC PANEL  CBC WITH DIFFERENTIAL/PLATELET     Medicines ordered and prescription  drug management: No orders of the defined types were placed in this encounter.   -I have reviewed the patients home medicines and have made adjustments as needed  Critical interventions none  Cardiac Monitoring: The patient was maintained on a cardiac monitor.  I personally viewed and interpreted the cardiac monitored which showed an underlying rhythm of: NSR  Social Determinants of Health:  Factors impacting patients care include: Difficulty with health insurance, TOC consult placed   Reevaluation: After the interventions noted above, I reevaluated the patient and found that they have :improved  Co morbidities that complicate the patient evaluation  Past Medical History:  Diagnosis Date  Diabetes mellitus without complication ([place])       Dispostion: I considered admission for this patient, but her joint pain and hyperglycemia currently do not meet inpatient criteria for admission.  Patient has close follow-up tomorrow and is safe for discharge with outpatient follow-up     Final Clinical Impression(s) / ED Diagnoses Final diagnoses:  None     '@PCDICTATION' @    Lumen Brinlee, Debe Coder, MD 03/14/22 [phone]

## 2022-03-14 LAB — CBG MONITORING, ED: Glucose-Capillary: 332 mg/dL — ABNORMAL HIGH (ref 70–99)

## 2022-03-14 MED ORDER — ACETAMINOPHEN 500 MG PO TABS
1000.0000 mg | ORAL_TABLET | Freq: Once | ORAL | Status: AC
  Administered 2022-03-14: 1000 mg via ORAL
  Filled 2022-03-14: qty 2

## 2022-03-14 MED ORDER — GABAPENTIN 300 MG PO CAPS: 300.0000 mg | ORAL_CAPSULE | Freq: Two times a day (BID) | ORAL | 1 refills | Status: AC

## 2022-03-14 MED ORDER — LACTATED RINGERS IV BOLUS
1000.0000 mL | Freq: Once | INTRAVENOUS | Status: AC
Start: 2022-03-14 — End: 2022-03-14
  Administered 2022-03-14: 1000 mL via INTRAVENOUS

## 2022-03-14 MED ORDER — KETOROLAC TROMETHAMINE 15 MG/ML IJ SOLN
15.0000 mg | Freq: Once | INTRAMUSCULAR | Status: DC
  Filled 2022-03-14: qty 1

## 2022-03-14 NOTE — Discharge Instructions (Signed)
You were seen in the emergency department for evaluation of multiple joint pains.  While it is possible that this is related to your diabetic neuropathy, I have higher concern that there may be an additional autoimmune process going on.  We did send a screening lab today that if positive will likely require further work-up by a rheumatologist.  At this time you are safe for discharge and we will need you to see your outpatient provider tomorrow as scheduled.  Please ask them to follow-up on your ANA results.  Also look out for a call from our social workers to assist with insurance problems.  Return to the emergency department if you have fevers, chest pain, shortness of breath, vomiting or any other concerning symptoms.

## 2022-03-15 LAB — ANA W/REFLEX IF POSITIVE: Anti Nuclear Antibody (ANA): NEGATIVE

## 2022-04-10 ENCOUNTER — Ambulatory Visit (INDEPENDENT_AMBULATORY_CARE_PROVIDER_SITE_OTHER): Payer: Self-pay

## 2022-04-10 ENCOUNTER — Encounter (HOSPITAL_COMMUNITY): Payer: Self-pay

## 2022-04-10 ENCOUNTER — Ambulatory Visit (HOSPITAL_COMMUNITY)
Admission: EM | Admit: 2022-04-10 | Discharge: 2022-04-10 | Disposition: A | Discharge status: Home / Self Care | Payer: Self-pay | Attending: Family Medicine | Admitting: Family Medicine

## 2022-04-10 DIAGNOSIS — M79645 Pain in left finger(s): Secondary | ICD-10-CM

## 2022-04-10 DIAGNOSIS — E1065 Type 1 diabetes mellitus with hyperglycemia: Secondary | ICD-10-CM

## 2022-04-10 DIAGNOSIS — M2550 Pain in unspecified joint: Secondary | ICD-10-CM

## 2022-04-10 DIAGNOSIS — E1042 Type 1 diabetes mellitus with diabetic polyneuropathy: Secondary | ICD-10-CM

## 2022-04-10 LAB — POCT URINALYSIS DIPSTICK, ED / UC
Bilirubin Urine: NEGATIVE
Glucose, UA: >=1000 mg/dL — AB
Hgb urine dipstick: NEGATIVE
Ketones, ur: NEGATIVE mg/dL
Leukocytes,Ua: NEGATIVE
Nitrite: NEGATIVE
Protein, ur: NEGATIVE mg/dL
Specific Gravity, Urine: 1.015 (ref 1.005–1.030)
Urobilinogen, UA: 0.2 mg/dL (ref 0.0–1.0)
pH: 6.5 (ref 5.0–8.0)

## 2022-04-10 LAB — CBG MONITORING, ED: Glucose-Capillary: 367 mg/dL — ABNORMAL HIGH (ref 70–99)

## 2022-04-10 MED ORDER — ADMELOG SOLOSTAR 100 UNIT/ML ~~LOC~~ SOPN: 8.0000 [IU] | PEN_INJECTOR | Freq: Three times a day (TID) | SUBCUTANEOUS | 1 refills | Status: AC

## 2022-04-10 MED ORDER — DICLOFENAC SODIUM 75 MG PO TBEC
75.0000 mg | DELAYED_RELEASE_TABLET | Freq: Two times a day (BID) | ORAL | 0 refills | Status: AC
Start: 2022-04-10 — End: ?

## 2022-04-13 ENCOUNTER — Encounter (HOSPITAL_COMMUNITY): Payer: Self-pay

## 2022-05-29 ENCOUNTER — Encounter (HOSPITAL_COMMUNITY): Payer: Self-pay

## 2022-05-29 ENCOUNTER — Ambulatory Visit (HOSPITAL_COMMUNITY)
Admission: EM | Admit: 2022-05-29 | Discharge: 2022-05-29 | Disposition: A | Discharge status: Home / Self Care | Payer: Commercial Managed Care - HMO | Attending: Emergency Medicine | Admitting: Emergency Medicine

## 2022-05-29 DIAGNOSIS — K047 Periapical abscess without sinus: Secondary | ICD-10-CM

## 2022-05-29 DIAGNOSIS — K029 Dental caries, unspecified: Secondary | ICD-10-CM

## 2022-05-29 MED ORDER — AMOXICILLIN-POT CLAVULANATE 875-125 MG PO TABS: 1.0000 | ORAL_TABLET | Freq: Two times a day (BID) | ORAL | 0 refills | Status: DC

## 2022-05-29 MED ORDER — CHLORHEXIDINE GLUCONATE 0.12 % MT SOLN: 15.0000 mL | Freq: Two times a day (BID) | OROMUCOSAL | 0 refills | Status: AC

## 2022-05-29 NOTE — ED Triage Notes (Signed)
Pt states right upper dental pain for the past 4 days.

## 2022-05-29 NOTE — Discharge Instructions (Addendum)
Please follow up with dental provider of your choice. Take antibiotic as directed(Augmentin), Peridex as directed. Dental resource handout given as well.

## 2022-05-29 NOTE — ED Provider Notes (Addendum)
[place]    CSN: [id number] Arrival date & time: 05/29/22  1938      History   Chief Complaint Chief Complaint  Patient presents with   Dental Pain    HPI Alan Burke is a 31 y.o. male.   31 year old male pt, Alan Burke, presents to Urgent Care c/o upper right dental pain x 4 days. Pt states he was seen at dental school today advised he had a dental infection and needed to have teeth fixed. Pt reports cold and heat sensitivity. Pt denies smoking,alcohol,or drug use. Vital signs are normal in Urgent Care, afebrile.   The history is provided by the patient. No language interpreter was used.    Past Medical History:  Diagnosis Date   Diabetes mellitus without complication [facility])     Patient Active Problem List   Diagnosis Date Noted   Pain due to dental caries 05/29/2022   Dental infection 05/29/2022    Past Surgical History:  Procedure Laterality Date   HAND SURGERY         Home Medications    Prior to Admission medications   Medication Sig Start Date End Date Taking? Authorizing Provider  amoxicillin-clavulanate (AUGMENTIN) 875-125 MG tablet Take 1 tablet by mouth every 12 (twelve) hours. 01/20/13  Yes Datron Brakebill, Jeanett Schlein, NP  chlorhexidine (PERIDEX) 0.12 % solution Use as directed 15 mLs in the mouth or throat 2 (two) times daily. 4/81/85  Yes Kymiah Araiza, Jeanett Schlein, NP  ADMELOG SOLOSTAR 100 UNIT/ML KwikPen Inject 8-12 Units into the skin 3 (three) times daily before meals. 04/10/22   Raspet, Derry Skill, PA-C  diclofenac (VOLTAREN) 75 MG EC tablet Take 1 tablet (75 mg total) by mouth 2 (two) times daily. 04/10/22   Raspet, Derry Skill, PA-C  gabapentin (NEURONTIN) 300 MG capsule Take 1 capsule (300 mg total) by mouth 2 (two) times daily. 03/14/22   Kommor, Madison, MD  Glucagon 3 MG/DOSE POWD Place 3 mg into the nose once as needed for up to 1 dose. 08/23/20   Philemon Kingdom, MD  Glucagon, rDNA, (GLUCAGON EMERGENCY) 1 MG KIT Please inject im as needed for  hypoglycemia 08/23/20   Philemon Kingdom, MD  Insulin Lispro-aabc, 1 U Dial, (LYUMJEV KWIKPEN) 100 UNIT/ML SOPN Use as directed 08/23/20   Philemon Kingdom, MD  Insulin Pen Needle 32G X 4 MM MISC Use up to 5x a day as advised 08/24/20   Philemon Kingdom, MD  LANTUS SOLOSTAR 100 UNIT/ML Solostar Pen 34 Units in the morning. 01/16/22   [provider]    Family History Family History  Problem Relation Age of Onset   Heart disease Paternal Grandfather     Social History Social History   Tobacco Use   Smoking status: Never   Smokeless tobacco: Never  Vaping Use   Vaping Use: Never used  Substance Use Topics   Alcohol use: Never   Drug use: No     Allergies   Patient has no known allergies.   Review of Systems Review of Systems  Constitutional:  Negative for fever.  HENT:  Positive for dental problem. Negative for facial swelling.   All other systems reviewed and are negative.    Physical Exam Triage Vital Signs ED Triage Vitals [05/29/22 1949]  Enc Vitals Group     BP 131/85     Pulse Rate 83     Resp 16     Temp 98.2 F (36.8 C)     Temp Source Oral  SpO2 95 %     Weight      Height      Head Circumference      Peak Flow      Pain Score 8     Pain Loc      Pain Edu?      Excl. in [place]?    No data found.  Updated Vital Signs BP 131/85 (BP Location: Left Arm)   Pulse 83   Temp 98.2 F (36.8 C) (Oral)   Resp 16   SpO2 95%   Visual Acuity Right Eye Distance:   Left Eye Distance:   Bilateral Distance:    Right Eye Near:   Left Eye Near:    Bilateral Near:     Physical Exam Vitals and nursing note reviewed.  Constitutional:      General: He is not in acute distress.    Appearance: He is well-developed and well-groomed.  HENT:     Head: Normocephalic and atraumatic.     Right Ear: Tympanic membrane normal.     Left Ear: Tympanic membrane normal.     Mouth/Throat:     Lips: Pink.     Mouth: Mucous membranes are moist.      Dentition: Abnormal dentition. Does not have dentures. Dental tenderness, gingival swelling and dental caries present. No dental abscesses or gum lesions.   Eyes:     Conjunctiva/sclera: Conjunctivae normal.  Cardiovascular:     Rate and Rhythm: Normal rate and regular rhythm.     Heart sounds: No murmur heard. Pulmonary:     Effort: Pulmonary effort is normal. No respiratory distress.     Breath sounds: Normal breath sounds.  Abdominal:     Palpations: Abdomen is soft.     Tenderness: There is no abdominal tenderness.  Musculoskeletal:        General: No swelling.     Cervical back: Neck supple.  Skin:    General: Skin is warm and dry.     Capillary Refill: Capillary refill takes less than 2 seconds.  Neurological:     Mental Status: He is alert.  Psychiatric:        Mood and Affect: Mood normal.        Behavior: Behavior is cooperative.      UC Treatments / Results  Labs (all labs ordered are listed, but only abnormal results are displayed) Labs Reviewed - No data to display  EKG   Radiology No results found.  Procedures Procedures (including critical care time)  Medications Ordered in UC Medications - No data to display  Initial Impression / Assessment and Plan / UC Course  I have reviewed the triage vital signs and the nursing notes.  Pertinent labs & imaging results that were available during my care of the patient were reviewed by me and considered in my medical decision making (see chart for details).    Pt to use OTC tylenol/ibuprofen/anbesol for pain.    Ddx: Dental pian,Dental caries, dental infection Final Clinical Impressions(s) / UC Diagnoses   Final diagnoses:  Pain due to dental caries  Dental infection     Discharge Instructions      Please follow up with dental provider of your choice. Take antibiotic as directed(Augmentin), Peridex as directed. Dental resource handout given as well.    ED Prescriptions     Medication Sig  Dispense Auth. Provider   amoxicillin-clavulanate (AUGMENTIN) 875-125 MG tablet Take 1 tablet by mouth every 12 (twelve) hours. 14 tablet Nguyen Butler,  Jeanett Schlein, NP   chlorhexidine (PERIDEX) 0.12 % solution Use as directed 15 mLs in the mouth or throat 2 (two) times daily. 258 mL Shyonna Carlin, Jeanett Schlein, NP      PDMP not reviewed this encounter.   Tori Milks, NP 52/77/82 4235    Tori Milks, NP 36/14/43 2006

## 2022-06-23 ENCOUNTER — Encounter (HOSPITAL_COMMUNITY): Payer: Self-pay

## 2022-06-23 ENCOUNTER — Emergency Department (HOSPITAL_COMMUNITY): Payer: Commercial Managed Care - HMO

## 2022-06-23 ENCOUNTER — Emergency Department (HOSPITAL_COMMUNITY)
Admission: EM | Admit: 2022-06-23 | Discharge: 2022-06-23 | Disposition: A | Discharge status: Home / Self Care | Payer: Commercial Managed Care - HMO | Attending: Emergency Medicine | Admitting: Emergency Medicine

## 2022-06-23 ENCOUNTER — Other Ambulatory Visit: Payer: Self-pay

## 2022-06-23 DIAGNOSIS — Y9301 Activity, walking, marching and hiking: Secondary | ICD-10-CM | POA: Insufficient documentation

## 2022-06-23 DIAGNOSIS — Z794 Long term (current) use of insulin: Secondary | ICD-10-CM | POA: Insufficient documentation

## 2022-06-23 DIAGNOSIS — M25562 Pain in left knee: Secondary | ICD-10-CM | POA: Diagnosis present

## 2022-06-23 MED ORDER — KETOROLAC TROMETHAMINE 30 MG/ML IJ SOLN
30.0000 mg | Freq: Once | INTRAMUSCULAR | Status: AC
  Administered 2022-06-23: 30 mg via INTRAMUSCULAR
  Filled 2022-06-23: qty 1

## 2022-06-23 NOTE — ED Provider Notes (Signed)
[facility] DEPT Provider Note   CSN: [id number] Arrival date & time: 06/23/22  1633     History  Chief Complaint  Patient presents with   Leg Swelling    Alan Burke is a 31 y.o. male.  HPI Patient presents with knee pain.  Onset was 3 days ago.  Onset was sudden as the patient was walking, heard a popping noise, felt similar sensation, and since that time has had pain throughout the anterior and lateral/medial areas of the knee, less so in the popliteal fossa.  Pain is worse with flexion and extension, though he has been able to ambulate, there is some discomfort.  Symptoms improved with nonweightbearing.  He has also tried ibuprofen, Tylenol without substantial change in his pain.    Home Medications Prior to Admission medications   Medication Sig Start Date End Date Taking? Authorizing Provider  ADMELOG SOLOSTAR 100 UNIT/ML KwikPen Inject 8-12 Units into the skin 3 (three) times daily before meals. 04/10/22   Raspet, Derry Skill, PA-C  amoxicillin-clavulanate (AUGMENTIN) 875-125 MG tablet Take 1 tablet by mouth every 12 (twelve) hours. 0/81/44   Defelice, Jeanett Schlein, NP  chlorhexidine (PERIDEX) 0.12 % solution Use as directed 15 mLs in the mouth or throat 2 (two) times daily. 06/02/55   Defelice, Jeanett Schlein, NP  diclofenac (VOLTAREN) 75 MG EC tablet Take 1 tablet (75 mg total) by mouth 2 (two) times daily. 04/10/22   Raspet, Derry Skill, PA-C  gabapentin (NEURONTIN) 300 MG capsule Take 1 capsule (300 mg total) by mouth 2 (two) times daily. 03/14/22   Kommor, Madison, MD  Glucagon 3 MG/DOSE POWD Place 3 mg into the nose once as needed for up to 1 dose. 08/23/20   Philemon Kingdom, MD  Glucagon, rDNA, (GLUCAGON EMERGENCY) 1 MG KIT Please inject im as needed for hypoglycemia 08/23/20   Philemon Kingdom, MD  Insulin Lispro-aabc, 1 U Dial, (LYUMJEV KWIKPEN) 100 UNIT/ML SOPN Use as directed 08/23/20   Philemon Kingdom, MD  Insulin Pen Needle 32G X 4 MM MISC Use up to 5x a  day as advised 08/24/20   Philemon Kingdom, MD  LANTUS SOLOSTAR 100 UNIT/ML Solostar Pen 34 Units in the morning. 01/16/22   [provider]      Allergies    Patient has no known allergies.    Review of Systems   Review of Systems  All other systems reviewed and are negative.   Physical Exam Updated Vital Signs BP (!) 138/94   Pulse 70   Temp 98.3 F (36.8 C) (Oral)   Resp 16   Ht '5\' 8"'  (1.727 m)   Wt 81.6 kg   SpO2 100%   BMI 27.37 kg/m  Physical Exam Vitals and nursing note reviewed.  Constitutional:      General: He is not in acute distress.    Appearance: He is well-developed.  HENT:     Head: Normocephalic and atraumatic.  Eyes:     Conjunctiva/sclera: Conjunctivae normal.  Cardiovascular:     Rate and Rhythm: Normal rate and regular rhythm.  Pulmonary:     Effort: Pulmonary effort is normal. No respiratory distress.     Breath sounds: No stridor.  Abdominal:     General: There is no distension.  Musculoskeletal:     Comments: Left ankle unremarkable, left hip unremarkable.  Left knee tender to palpation anterior, medial, lateral, but no instability.  Patient can flex and extend against resistance, though he has pain throughout.  Skin:  General: Skin is warm and dry.  Neurological:     Mental Status: He is alert and oriented to person, place, and time.     ED Results / Procedures / Treatments   Labs (all labs ordered are listed, but only abnormal results are displayed) Labs Reviewed - No data to display  EKG None  Radiology DG Knee Complete 4 Views Left  Result Date: 06/23/2022 CLINICAL DATA:  Worsening pain and swelling in left lower leg EXAM: LEFT KNEE - COMPLETE 4+ VIEW COMPARISON:  None Available. FINDINGS: No evidence of fracture or dislocation. Trace knee joint effusion. No evidence of arthropathy or other focal bone abnormality. Soft tissues are unremarkable. IMPRESSION: No acute fracture or dislocation. Electronically Signed   By:  Placido Sou M.D.   On: 06/23/2022 19:32    Procedures Procedures    Medications Ordered in ED Medications  ketorolac (TORADOL) 30 MG/ML injection 30 mg (has no administration in time range)    This patient with a Hx of no medical problems presents to the ED for concern of knee pain, this involves an extensive number of treatment options, and is a complaint that carries with it a high risk of complications and morbidity.    The differential diagnosis includes fracture, soft tissue injury   Social Determinants of Health:  No limits   After the initial evaluation, orders, including: X-ray were initiated.   On repeat evaluation of the patient stayed the same Imaging Studies ordered:  I independently visualized and interpreted imaging which showed unremarkable x-ray I agree with the radiologist interpretation  Dispostion / Final MDM:  After consideration of the diagnostic results and the patient's response to treatment, this adult male presents 3 days after sustaining an injury.  Here he is awake, alert, distally neurovascularly intact, has findings consistent with soft tissue injury given reassuring x-ray.  Patient received IM Toradol we will continue with ibuprofen, crutches, has a brace at home that he will wear, was discharged to follow-up with orthopedics.  Final Clinical Impression(s) / ED Diagnoses Final diagnoses:  Acute pain of left knee    Rx / DC Orders ED Discharge Orders     None         Carmin Muskrat, MD 06/23/22 1954

## 2022-06-23 NOTE — Discharge Instructions (Signed)
As discussed, today's evaluation has been somewhat reassuring.  Your x-ray does not demonstrate a broken bone.  However, with concern for soft tissue injury it is important that you use the provided crutches, and your knee brace.  Please take ibuprofen, 400 mg, 3 times daily with food for the next 3 days.  [facility] needed if your symptoms do not improve.

## 2022-06-23 NOTE — ED Triage Notes (Signed)
"  Pain and swelling in left lower leg since Wednesday, I usually have pain due to my diabetes, but this swelling and pain is getting worse" per pt

## 2022-06-30 ENCOUNTER — Other Ambulatory Visit: Payer: Self-pay

## 2022-06-30 ENCOUNTER — Emergency Department (HOSPITAL_COMMUNITY)
Admission: EM | Admit: 2022-06-30 | Discharge: 2022-06-30 | Disposition: A | Discharge status: Home / Self Care | Payer: Commercial Managed Care - HMO | Attending: Emergency Medicine | Admitting: Emergency Medicine

## 2022-06-30 ENCOUNTER — Encounter (HOSPITAL_COMMUNITY): Payer: Self-pay

## 2022-06-30 DIAGNOSIS — M25562 Pain in left knee: Secondary | ICD-10-CM | POA: Insufficient documentation

## 2022-06-30 DIAGNOSIS — Y9301 Activity, walking, marching and hiking: Secondary | ICD-10-CM | POA: Insufficient documentation

## 2022-06-30 DIAGNOSIS — G8911 Acute pain due to trauma: Secondary | ICD-10-CM | POA: Insufficient documentation

## 2022-06-30 DIAGNOSIS — X509XXA Other and unspecified overexertion or strenuous movements or postures, initial encounter: Secondary | ICD-10-CM | POA: Diagnosis not present

## 2022-06-30 NOTE — ED Provider Notes (Signed)
[facility] DEPT Provider Note   CSN: [id number] Arrival date & time: 06/30/22  1948     History  Chief Complaint  Patient presents with   Knee Injury    Alan Burke is a 31 y.o. male presenting with left knee pain since last Wednesday.  He said when he was walking he started to hear and feels a popping in his left knee.  He was seen in the emergency department on the ninth and again at urgent care on the 12th with negative x-rays.  He has an MRI scheduled for Thursday but is having severe pain and says he cannot wait.  He would like an MRI.  HPI     Home Medications Prior to Admission medications   Medication Sig Start Date End Date Taking? Authorizing Provider  ADMELOG SOLOSTAR 100 UNIT/ML KwikPen Inject 8-12 Units into the skin 3 (three) times daily before meals. 04/10/22   Raspet, Derry Skill, PA-C  amoxicillin-clavulanate (AUGMENTIN) 875-125 MG tablet Take 1 tablet by mouth every 12 (twelve) hours. 1/79/15   Defelice, Jeanett Schlein, NP  chlorhexidine (PERIDEX) 0.12 % solution Use as directed 15 mLs in the mouth or throat 2 (two) times daily. 0/56/97   Defelice, Jeanett Schlein, NP  diclofenac (VOLTAREN) 75 MG EC tablet Take 1 tablet (75 mg total) by mouth 2 (two) times daily. 04/10/22   Raspet, Derry Skill, PA-C  gabapentin (NEURONTIN) 300 MG capsule Take 1 capsule (300 mg total) by mouth 2 (two) times daily. 03/14/22   Kommor, Laksh Hinners, MD  Glucagon 3 MG/DOSE POWD Place 3 mg into the nose once as needed for up to 1 dose. 08/23/20   Philemon Kingdom, MD  Glucagon, rDNA, (GLUCAGON EMERGENCY) 1 MG KIT Please inject im as needed for hypoglycemia 08/23/20   Philemon Kingdom, MD  Insulin Lispro-aabc, 1 U Dial, (LYUMJEV KWIKPEN) 100 UNIT/ML SOPN Use as directed 08/23/20   Philemon Kingdom, MD  Insulin Pen Needle 32G X 4 MM MISC Use up to 5x a day as advised 08/24/20   Philemon Kingdom, MD  LANTUS SOLOSTAR 100 UNIT/ML Solostar Pen 34 Units in the morning. 01/16/22   [provider]      Allergies    Patient has no known allergies.    Review of Systems   Review of Systems  Physical Exam Updated Vital Signs BP (!) 138/92 (BP Location: Left Arm)   Pulse 71   Temp 97.8 F (36.6 C) (Oral)   Resp 18   Ht _0  (1.753 m)   Wt 83.5 kg   SpO2 100%   BMI 27.17 kg/m  Physical Exam Vitals and nursing note reviewed.  Constitutional:      Appearance: Normal appearance.  HENT:     Head: Normocephalic and atraumatic.  Eyes:     General: No scleral icterus.    Conjunctiva/sclera: Conjunctivae normal.  Pulmonary:     Effort: Pulmonary effort is normal. No respiratory distress.  Musculoskeletal:        General: Tenderness present. No swelling, deformity or signs of injury. Normal range of motion.     Comments: Full range of motion of the knee.  Negative anterior drawer and posterior drawer.  Pain with varus and valgus but no laxity.  Strong and intact DP pulse.  No sensation deficit  Skin:    General: Skin is warm and dry.     Findings: No bruising, erythema or rash.  Neurological:     Mental Status: He is alert.  Psychiatric:  Mood and Affect: Mood normal.     ED Results / Procedures / Treatments   Labs (all labs ordered are listed, but only abnormal results are displayed) Labs Reviewed - No data to display  EKG None  Radiology No results found.  Procedures Procedures   Medications Ordered in ED Medications - No data to display  ED Course/ Medical Decision Making/ A&P                           Medical Decision Making  31 year old male presenting with knee pain.  Has been seen for this twice recently.  He is requesting MRI today.  Unfortunately, he is neurovascularly intact and does not qualify for emergent MRI via the emergency department.  He says he already has one scheduled for Thursday.  He will need to have this done on Thursday and continue the at-home therapies that he has been pursuing.  Patient is very frustrated  however understanding that we are unable to do this nonemergent MRI.  Left without discharge papers.   Final Clinical Impression(s) / ED Diagnoses Final diagnoses:  Acute pain of left knee    Rx / DC Orders ED Discharge Orders     None      Results and diagnoses were explained to the patient. Return precautions discussed in full. Patient had no additional questions and expressed complete understanding.   This chart was dictated using voice recognition software.  Despite best efforts to proofread,  errors can occur which can change the documentation meaning.    Rhae Hammock, PA-C 06/30/22 2040    Drenda Freeze, MD 06/30/22 [phone]

## 2022-06-30 NOTE — ED Notes (Signed)
Pt refused discharge paperwork.

## 2022-06-30 NOTE — Discharge Instructions (Addendum)
You will need to get your MRI on Thursday.  I am unable to schedule one sooner.  You may continue with the ibuprofen and lidocaine patches and to help you are able to be fully seen by orthopedics for any necessary intervention.  It was a pleasure to meet you and we hope you feel better!

## 2022-06-30 NOTE — ED Triage Notes (Signed)
Ambulatory to ED with c/o L knee pain. States he thinks he might have tore his meniscus, requesting an MRI.

## 2022-08-20 ENCOUNTER — Other Ambulatory Visit: Payer: Self-pay

## 2022-08-20 ENCOUNTER — Ambulatory Visit
Admission: EM | Admit: 2022-08-20 | Discharge: 2022-08-20 | Disposition: A | Discharge status: Home / Self Care | Payer: Commercial Managed Care - HMO | Attending: Urgent Care | Admitting: Urgent Care

## 2022-08-20 DIAGNOSIS — K0889 Other specified disorders of teeth and supporting structures: Secondary | ICD-10-CM

## 2022-08-20 MED ORDER — AMOXICILLIN-POT CLAVULANATE 875-125 MG PO TABS: 1.0000 | ORAL_TABLET | Freq: Two times a day (BID) | ORAL | 0 refills | Status: AC

## 2022-08-20 NOTE — Discharge Instructions (Signed)
[facility] in [place], [place] Address: [place], [place], [place] [address] Phone: [phone]  [place] [phone] extension [phone] [address]  Dr. Donn Pierini [phone] [place] [phone] [address] [facility] [place].  [facility] [phone] extension [phone], [place], [place], [address] First come first serve for the first 10 clients.  May do simple extractions only, no wisdom teeth or surgery.  You may try the second for Thursday of the month starting at 6:30 AM.

## 2022-08-20 NOTE — ED Triage Notes (Signed)
Pt c/o intermittent right upper dental pain x 2 years-NAD-steady gait

## 2022-08-20 NOTE — ED Provider Notes (Signed)
[facility]RG[facility]  Note:  This document was prepared using Systems analyst and may include unintentional dictation errors.  MRN: [id number] DOB: 1991-01-03  Subjective:   Alan Burke is a 31 y.o. male presenting for acute on chronic severe right upper dental pain.  Patient has previously had dental infections and has had to have dental extractions.  This very very expensive for him.  Right now he is in the process of obtaining dental insurance through his insurance carrier.  He was informed to come to [place] or to the emergency room to get a referral to dentistry.  He plans on doing this whether through [place] or the emergency room.  No current facility-administered medications for this encounter.  Current Outpatient Medications:    ADMELOG SOLOSTAR 100 UNIT/ML KwikPen, Inject 8-12 Units into the skin 3 (three) times daily before meals., Disp: 15 mL, Rfl: 1   amoxicillin-clavulanate (AUGMENTIN) 875-125 MG tablet, Take 1 tablet by mouth every 12 (twelve) hours., Disp: 14 tablet, Rfl: 0   chlorhexidine (PERIDEX) 0.12 % solution, Use as directed 15 mLs in the mouth or throat 2 (two) times daily., Disp: 120 mL, Rfl: 0   diclofenac (VOLTAREN) 75 MG EC tablet, Take 1 tablet (75 mg total) by mouth 2 (two) times daily., Disp: 14 tablet, Rfl: 0   gabapentin (NEURONTIN) 300 MG capsule, Take 1 capsule (300 mg total) by mouth 2 (two) times daily., Disp: 60 capsule, Rfl: 1   Glucagon 3 MG/DOSE POWD, Place 3 mg into the nose once as needed for up to 1 dose., Disp: 1 each, Rfl: 11   Glucagon, rDNA, (GLUCAGON EMERGENCY) 1 MG KIT, Please inject im as needed for hypoglycemia, Disp: 1 kit, Rfl: 11   Insulin Lispro-aabc, 1 U Dial, (LYUMJEV KWIKPEN) 100 UNIT/ML SOPN, Use as directed, Disp: 100 mL, Rfl: 0   Insulin Pen Needle 32G X 4 MM MISC, Use up to 5x a day as advised, Disp: 400 each, Rfl: 3   LANTUS SOLOSTAR 100 UNIT/ML Solostar Pen, 34 Units in the morning., Disp: , Rfl:    No  Known Allergies  Past Medical History:  Diagnosis Date   Diabetes mellitus without complication (HCC)      Past Surgical History:  Procedure Laterality Date   HAND SURGERY     KNEE SURGERY      Family History  Problem Relation Age of Onset   Heart disease Paternal Grandfather     Social History   Tobacco Use   Smoking status: Never   Smokeless tobacco: Never  Vaping Use   Vaping Use: Never used  Substance Use Topics   Alcohol use: Never   Drug use: No    ROS   Objective:   Vitals: BP 133/79 (BP Location: Left Arm)   Pulse 84   Temp 98.2 F (36.8 C) (Oral)   Resp 18   Ht _0  (1.727 m)   Wt 182 lb (82.6 kg)   SpO2 97%   BMI 27.67 kg/m   Physical Exam Constitutional:      General: He is not in acute distress.    Appearance: Normal appearance. He is well-developed and normal weight. He is not ill-appearing, toxic-appearing or diaphoretic.  HENT:     Head: Normocephalic and atraumatic.     Right Ear: External ear normal.     Left Ear: External ear normal.     Nose: Nose normal.     Mouth/Throat:     Pharynx: Oropharynx is  clear.   Eyes:     General: No scleral icterus.       Right eye: No discharge.        Left eye: No discharge.     Extraocular Movements: Extraocular movements intact.  Cardiovascular:     Rate and Rhythm: Normal rate.  Pulmonary:     Effort: Pulmonary effort is normal.  Musculoskeletal:     Cervical back: Normal range of motion.  Neurological:     Mental Status: He is alert and oriented to person, place, and time.  Psychiatric:        Mood and Affect: Mood normal.        Behavior: Behavior normal.        Thought Content: Thought content normal.        Judgment: Judgment normal.      Assessment and Plan :   PDMP not reviewed this encounter.  1. Pain, dental     I do not believe that patient is appropriate for the emergency room.  I attempted a referral to dentistry through our internal referral system.  I explained  the patient that unfortunately we cannot do referrals to other general dentistry practices.  He verbalized understanding and will attempt to schedule an appointment to the clinics provided. Start Augmentin for dental infection/abscess, use naproxen for pain and inflammation.  Counseled patient on potential for adverse effects with medications prescribed/recommended today, strict ER and return-to-clinic precautions discussed, patient verbalized understanding.    Jaynee Eagles, PA-C 08/20/22 1659

## 2022-08-28 IMAGING — CT CT ANGIO CHEST
2 of 7 series · 18 of 46 positions shown · IV contrast (omnipaque)
Comparison: None.

CLINICAL DATA: Right-sided intermittent chest pain for 2 weeks.

EXAM:
CT ANGIOGRAPHY CHEST WITH CONTRAST
TECHNIQUE: Multidetector CT imaging of the chest was performed using the
standard protocol during bolus administration of intravenous
contrast. Multiplanar CT image reconstructions and MIPs were
obtained to evaluate the vascular anatomy.
CONTRAST:  100mL OMNIPAQUE IOHEXOL 350 MG/ML SOLN

[Series 6: thins · axial · 0.68mm/px · z∈[+1121,+1389]mm · 15 of 302 slices shown]
[im 17/302  lung]
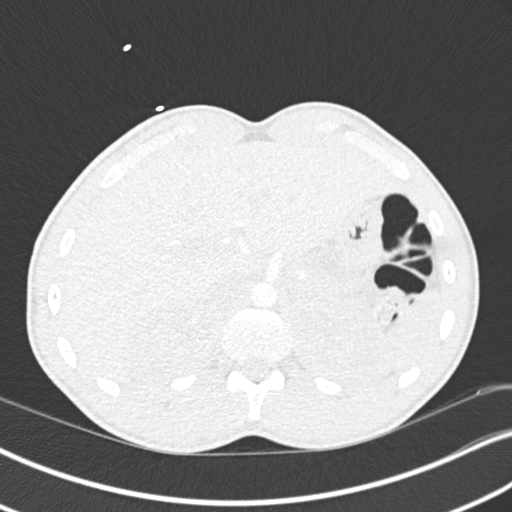
[im 34/302  soft-tissue]
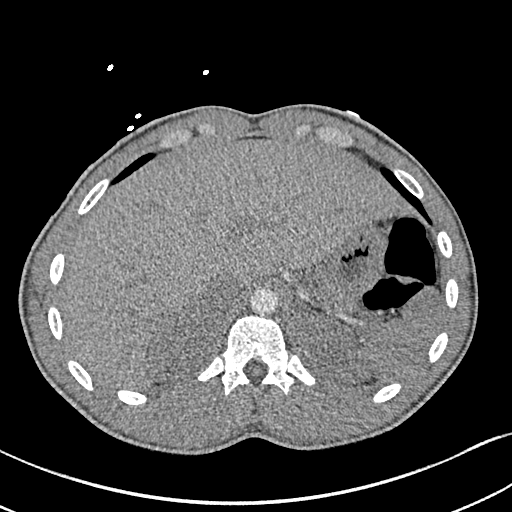
[im 51/302  lung]
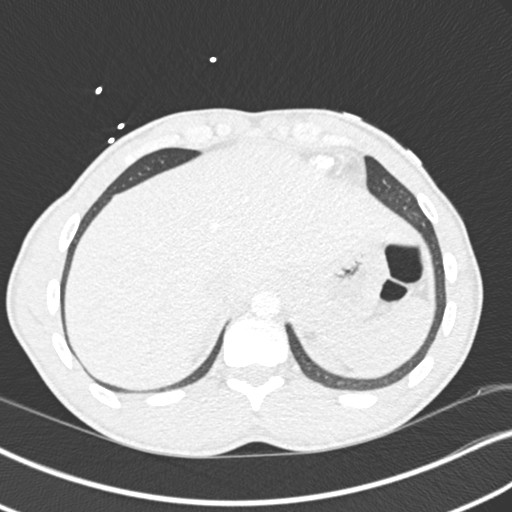
[im 67/302  soft-tissue]
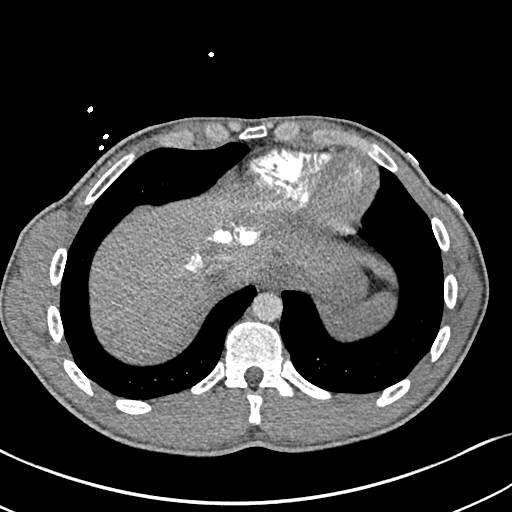
[im 101/302  lung]
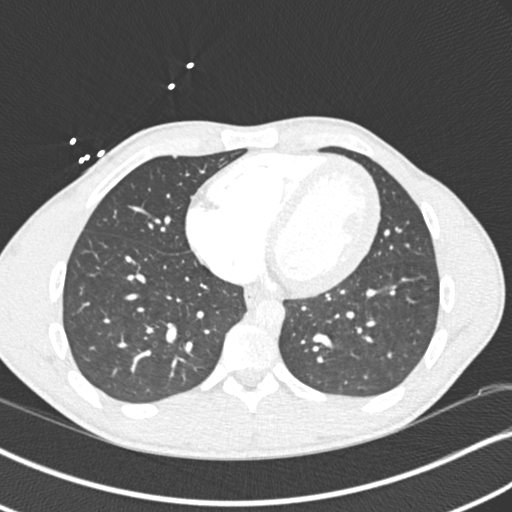
[im 118/302  soft-tissue]
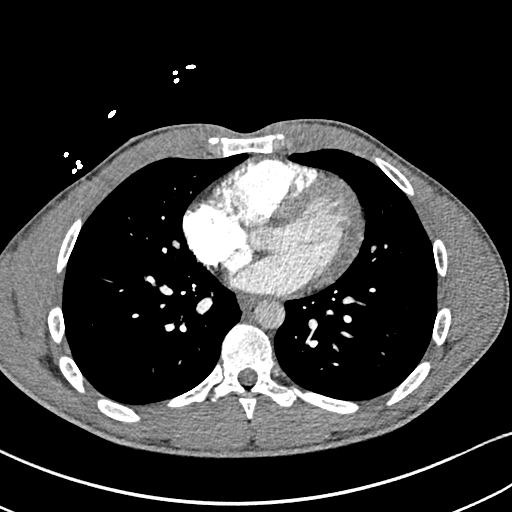
[im 134/302  lung]
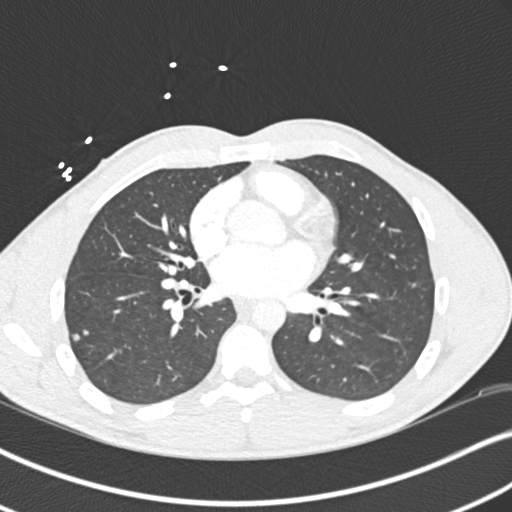
[im 151/302  soft-tissue]
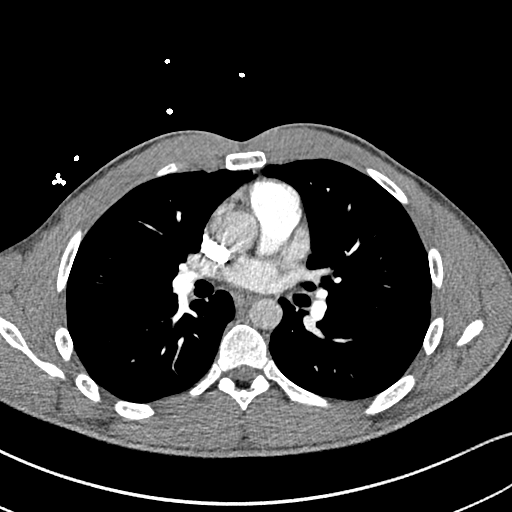
[im 168/302  lung]
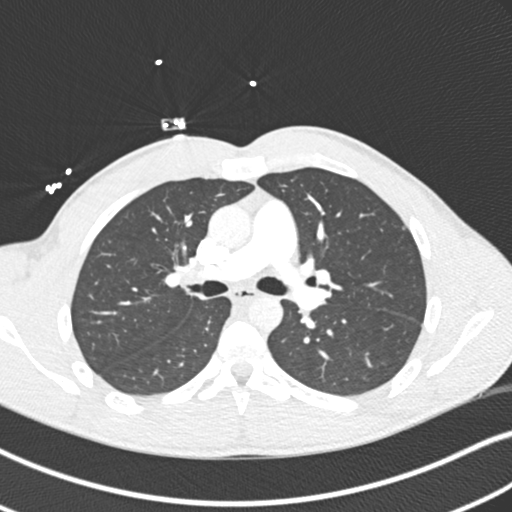
[im 184/302  soft-tissue]
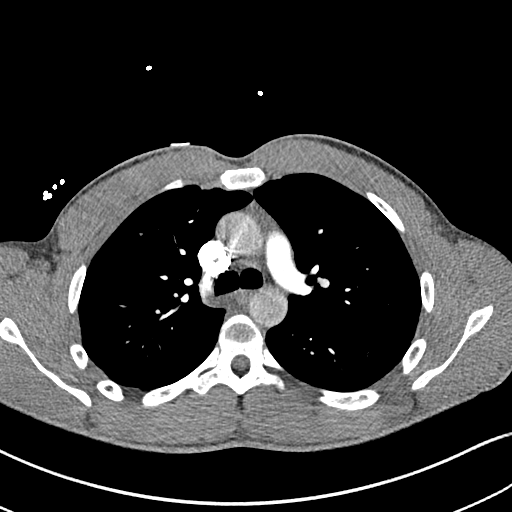
[im 201/302  lung]
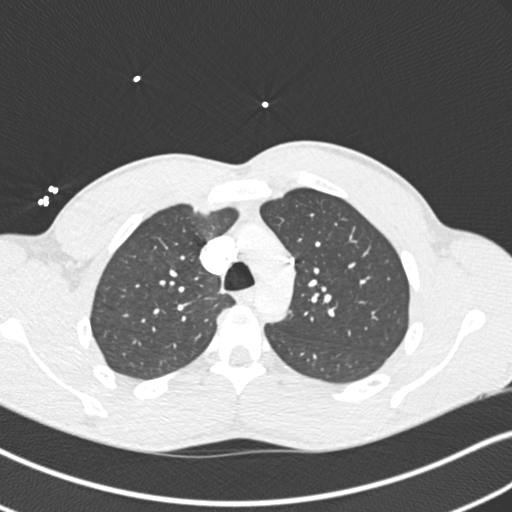
[im 235/302  soft-tissue]
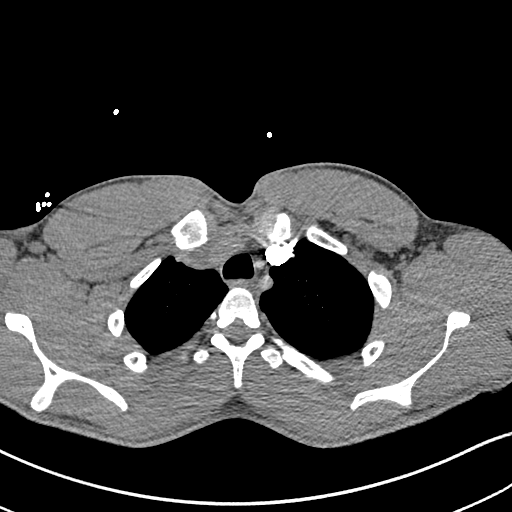
[im 251/302  lung]
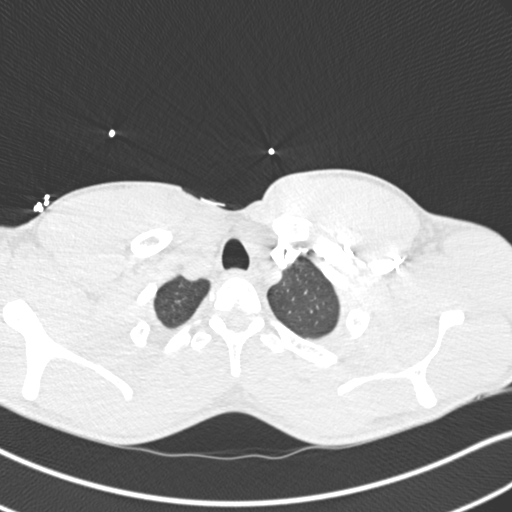
[im 268/302  soft-tissue]
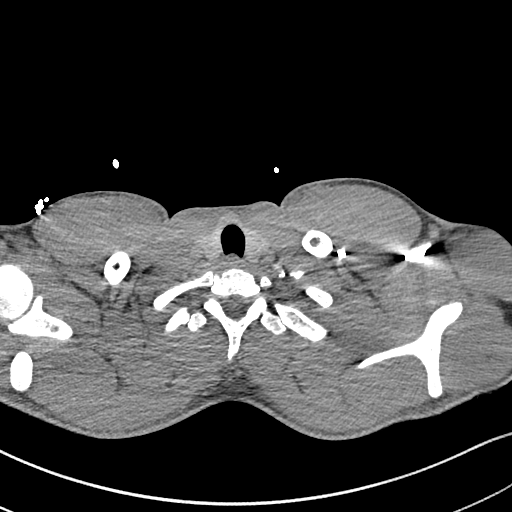
[im 285/302  lung]
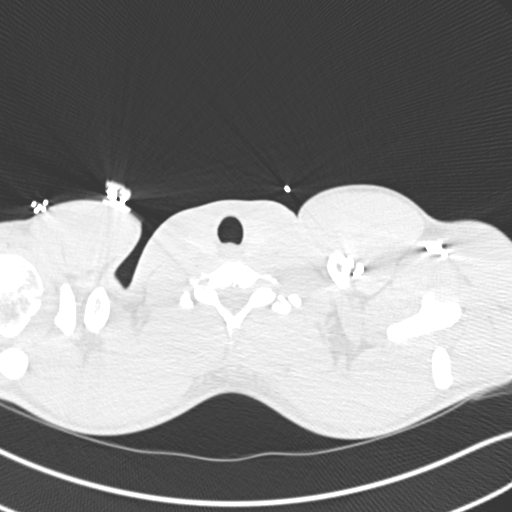

[Series 10: coronal mpr · coronal · 0.59mm/px · 3 of 118 slices shown]
[im 30/118  soft-tissue]
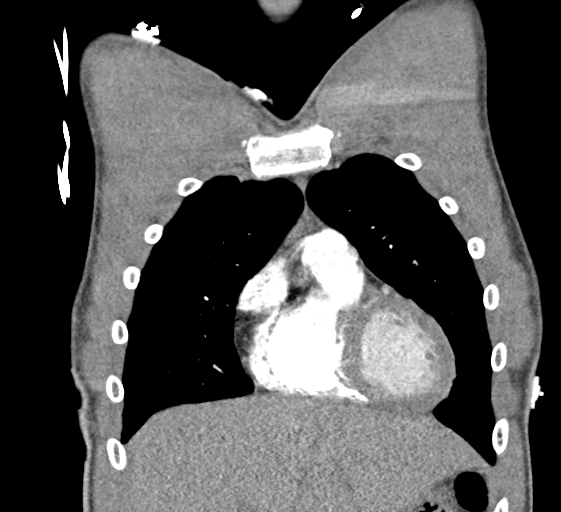
[im 59/118  soft-tissue]
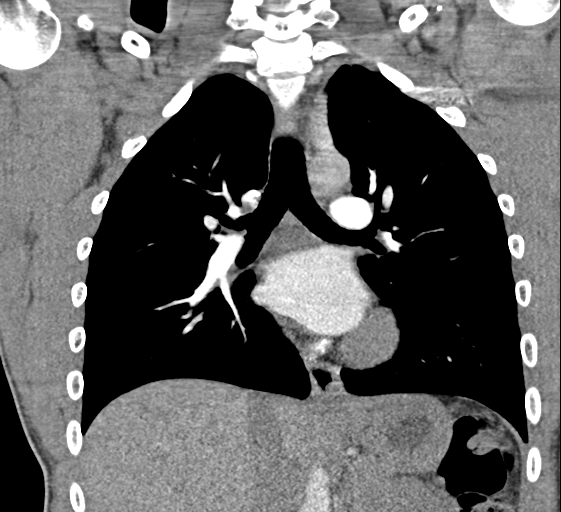
[im 88/118  soft-tissue]
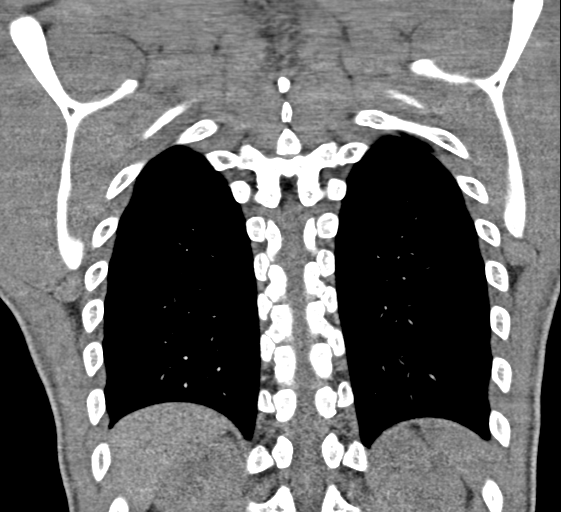

[18 of 46 positions shown; findings below may reference images not displayed]

FINDINGS: Cardiovascular: Satisfactory opacification of the pulmonary arteries
to the segmental level. No evidence of pulmonary embolism. Normal
heart size. No pericardial effusion.

Mediastinum/Nodes: No enlarged mediastinal, hilar, or axillary lymph
nodes. Thyroid gland, trachea, and esophagus demonstrate no
significant findings.

Lungs/Pleura: 2 adjacent nodules are demonstrated in the superior
segment of the right lower lobe, each measuring about 4 mm diameter.
There is another nodule in the right middle lobe measuring about 6
mm in diameter. This nodule is associated with the minor fissure and
likely represents a fissural lymph node. No other nodules are
demonstrated. No consolidation or edema. No pleural effusions. No
pneumothorax. Airways are patent.

Upper Abdomen: No acute abnormality.

Musculoskeletal: No chest wall abnormality. No acute or significant
osseous findings.

Review of the MIP images confirms the above findings.
IMPRESSION: 1. No evidence of significant pulmonary embolus.
2. No evidence of active pulmonary disease.
3. Right lung nodules, largest measuring 6 mm in diameter, likely
representing a fissural lymph node. Based on size, location, and
patient age, these are likely benign.

## 2022-11-14 ENCOUNTER — Ambulatory Visit
Admission: EM | Admit: 2022-11-14 | Discharge: 2022-11-14 | Disposition: A | Discharge status: Home / Self Care | Payer: Commercial Managed Care - HMO

## 2022-11-14 ENCOUNTER — Emergency Department (HOSPITAL_COMMUNITY)
Admission: EM | Admit: 2022-11-14 | Discharge: 2022-11-14 | Discharge status: Against Medical Advice | Payer: Commercial Managed Care - HMO | Attending: Emergency Medicine | Admitting: Emergency Medicine

## 2022-11-14 DIAGNOSIS — R519 Headache, unspecified: Secondary | ICD-10-CM | POA: Insufficient documentation

## 2022-11-14 DIAGNOSIS — E119 Type 2 diabetes mellitus without complications: Secondary | ICD-10-CM | POA: Diagnosis not present

## 2022-11-14 DIAGNOSIS — Z794 Long term (current) use of insulin: Secondary | ICD-10-CM | POA: Insufficient documentation

## 2022-11-14 DIAGNOSIS — H5712 Ocular pain, left eye: Secondary | ICD-10-CM | POA: Diagnosis not present

## 2022-11-14 DIAGNOSIS — Z5329 Procedure and treatment not carried out because of patient's decision for other reasons: Secondary | ICD-10-CM | POA: Diagnosis not present

## 2022-11-14 MED ORDER — ACETAMINOPHEN 500 MG PO TABS: 1000.0000 mg | ORAL_TABLET | Freq: Once | ORAL | Status: DC

## 2022-11-14 MED ORDER — SODIUM CHLORIDE 0.9 % IV BOLUS: 1000.0000 mL | Freq: Once | INTRAVENOUS | Status: DC

## 2022-11-14 MED ORDER — DIPHENHYDRAMINE HCL 50 MG/ML IJ SOLN: 25.0000 mg | Freq: Once | INTRAMUSCULAR | Status: DC

## 2022-11-14 MED ORDER — PROCHLORPERAZINE EDISYLATE 10 MG/2ML IJ SOLN: 10.0000 mg | Freq: Once | INTRAMUSCULAR | Status: DC

## 2022-11-14 NOTE — ED Provider Notes (Signed)
[facility] Provider Note   CSN: [id number] Arrival date & time: 11/14/22  1145     History  Chief Complaint  Patient presents with   Headache    Alan Burke is a 32 y.o. male who presents to the emergency department with concerns for left-sided headache that has been ongoing for 3 weeks.  Notes that his symptoms have worsened over the past couple of days.  Has tried over-the-counter medications for his symptoms.  Has left nare epistaxis this been going on for the past couple days upon waking.  Denies dizziness, lightheadedness, chest pain, shortness of breath, abdominal pain, nausea, vomiting, numbness, tingling, weakness.  Notes he has a history of diabetes that is poorly controlled, is not currently taking any medications for his diabetes.  The history is provided by the patient. No language interpreter was used.       Home Medications Prior to Admission medications   Medication Sig Start Date End Date Taking? Authorizing Provider  ADMELOG SOLOSTAR 100 UNIT/ML KwikPen Inject 8-12 Units into the skin 3 (three) times daily before meals. 04/10/22   Raspet, Derry Skill, PA-C  amoxicillin-clavulanate (AUGMENTIN) 875-125 MG tablet Take 1 tablet by mouth 2 (two) times daily. 08/20/22   Jaynee Eagles, PA-C  chlorhexidine (PERIDEX) 0.12 % solution Use as directed 15 mLs in the mouth or throat 2 (two) times daily. 9/50/93   Defelice, Jeanett Schlein, NP  diclofenac (VOLTAREN) 75 MG EC tablet Take 1 tablet (75 mg total) by mouth 2 (two) times daily. 04/10/22   Raspet, Derry Skill, PA-C  gabapentin (NEURONTIN) 300 MG capsule Take 1 capsule (300 mg total) by mouth 2 (two) times daily. 03/14/22   Kommor, Madison, MD  Glucagon 3 MG/DOSE POWD Place 3 mg into the nose once as needed for up to 1 dose. 08/23/20   Philemon Kingdom, MD  Glucagon, rDNA, (GLUCAGON EMERGENCY) 1 MG KIT Please inject im as needed for hypoglycemia 08/23/20   Philemon Kingdom, MD  Insulin Lispro-aabc,  1 U Dial, (LYUMJEV KWIKPEN) 100 UNIT/ML SOPN Use as directed 08/23/20   Philemon Kingdom, MD  Insulin Pen Needle 32G X 4 MM MISC Use up to 5x a day as advised 08/24/20   Philemon Kingdom, MD  LANTUS SOLOSTAR 100 UNIT/ML Solostar Pen 34 Units in the morning. 01/16/22   [provider]      Allergies    Patient has no known allergies.    Review of Systems   Review of Systems  Neurological:  Positive for headaches.  All other systems reviewed and are negative.   Physical Exam Updated Vital Signs BP 130/81 (BP Location: Left Arm)   Pulse 87   Temp 98.5 F (36.9 C) (Oral)   Resp 16   SpO2 100%  Physical Exam Vitals and nursing note reviewed.  Constitutional:      General: He is not in acute distress.    Appearance: Normal appearance.  Eyes:     General: No scleral icterus.    Extraocular Movements: Extraocular movements intact.  Cardiovascular:     Rate and Rhythm: Normal rate.  Pulmonary:     Effort: Pulmonary effort is normal. No respiratory distress.  Abdominal:     Palpations: Abdomen is soft. There is no mass.     Tenderness: There is no abdominal tenderness.  Musculoskeletal:        General: Normal range of motion.     Cervical back: Neck supple.  Skin:    General: Skin  is warm and dry.     Findings: No rash.  Neurological:     Mental Status: He is alert.     Sensory: Sensation is intact.     Motor: Motor function is intact.     Comments: No focal neurological deficits. Negative pronator drift. Able to ambulate without assistance or difficulty. Strength and sensation intact to BUE and BLE. Grip strength 5/5 bilaterally.    Psychiatric:        Behavior: Behavior normal.     ED Results / Procedures / Treatments   Labs (all labs ordered are listed, but only abnormal results are displayed) Labs Reviewed - No data to display  EKG None  Radiology No results found.  Procedures Procedures    Medications Ordered in ED Medications - No data to  display  ED Course/ Medical Decision Making/ A&P Clinical Course as of 11/14/22 1620  Wed Nov 14, 2022  1306 Notified by paramedic that patient is denying any labs or medications to treat his headache at this time.  [SB]  1306 Discussed with patient lab and medications.  At this time patient declines to me that he does not want any labs done nor does he want any medications at this time.  Patient notes that he just wants a scan of his head completed. [SB]  6301 Notified by staff that patient left AMA and that patient had already walked out of the ED. Prior to me being notified about patients plan to leave.  [SB]    Clinical Course User Index [SB] [name], PA-C                             Medical Decision Making  Pt presented to the ED with concerns for left sided headache x 3 weeks. No focal neurological deficits noted on exam. Negative pronator drift. Differential diagnosis includes SAH, ICH, migraine, tension headache.   Additional history obtained:  External records from outside source obtained and reviewed including: Pt was seen at UC earlier today for similar symptoms. Was informed to come into the ED for further evaluation of his symptoms with a CT head and further work-up.   Imaging: I ordered imaging studies including CT head wo contrast ordered  Disposition: Notified by staff that patient left AMA by ambulating out of the ED. I was notified that patient left AMA after patient had already left the ED.     This chart was dictated using voice recognition software, Dragon. Despite the best efforts of this provider to proofread and correct errors, errors may still occur which can change documentation meaning.   Final Clinical Impression(s) / ED Diagnoses Final diagnoses:  Bad headache    Rx / DC Orders ED Discharge Orders     None         Pascual Mantel A, PA-C 11/14/22 1620    Audley Hose, MD 11/14/22 2258

## 2022-11-14 NOTE — Discharge Instructions (Signed)
Please go to the emergency room for further evaluation of your headache You should follow-up with ophthalmology given your history of diabetes

## 2022-11-14 NOTE — ED Provider Notes (Signed)
[facility]    CSN: [id number] Arrival date & time: 11/14/22  1028      History   Chief Complaint Chief Complaint  Patient presents with   Eye Problem    HPI Alan Burke is a 32 y.o. male presents for evaluation of eye pain and headache.  Patient has a past medical history of type 1 diabetes.  He reports a year of worsening left-sided headaches with left eye pain that occur daily and sometimes multiple times a day.  It can last a couple hours and resolve on its own.  It is associated with blurry vision, photosensitivity, and nosebleed from his left nostril only.  He denies dizziness, flashing lights in his vision, aura, neck pain.  Denies history of migraines or headaches.  He states this morning he awoke with a headache and states it was the worst headache of his life.  He reports when he had his glasses made 3 years ago he was told by the optometrist that he had a "line" across the back of his left eye advised him to see ophthalmology.  Patient has not done this.  Patient states has been to the ER multiple times for eye pain and was told it was "nothing".  Denies any history of head CT.  Denies any family history of first-degree relative with a SAH. He has not taken any OTC medications for his HA.  He states he has a Dexcom that is not currently hooked up and he was post to meet with somebody today to have hooked up.  He does have a PCP but has not seen endocrinology.   Eye Problem Associated symptoms: headaches and photophobia     Past Medical History:  Diagnosis Date   Diabetes mellitus without complication [facility])     Patient Active Problem List   Diagnosis Date Noted   Pain due to dental caries 05/29/2022   Dental infection 05/29/2022    Past Surgical History:  Procedure Laterality Date   HAND SURGERY     KNEE SURGERY         Home Medications    Prior to Admission medications   Medication Sig Start Date End Date Taking? Authorizing Provider  ADMELOG  SOLOSTAR 100 UNIT/ML KwikPen Inject 8-12 Units into the skin 3 (three) times daily before meals. 04/10/22   Raspet, Derry Skill, PA-C  amoxicillin-clavulanate (AUGMENTIN) 875-125 MG tablet Take 1 tablet by mouth 2 (two) times daily. 08/20/22   Jaynee Eagles, PA-C  chlorhexidine (PERIDEX) 0.12 % solution Use as directed 15 mLs in the mouth or throat 2 (two) times daily. 7/82/95   Defelice, Jeanett Schlein, NP  diclofenac (VOLTAREN) 75 MG EC tablet Take 1 tablet (75 mg total) by mouth 2 (two) times daily. 04/10/22   Raspet, Derry Skill, PA-C  gabapentin (NEURONTIN) 300 MG capsule Take 1 capsule (300 mg total) by mouth 2 (two) times daily. 03/14/22   Kommor, Madison, MD  Glucagon 3 MG/DOSE POWD Place 3 mg into the nose once as needed for up to 1 dose. 08/23/20   Philemon Kingdom, MD  Glucagon, rDNA, (GLUCAGON EMERGENCY) 1 MG KIT Please inject im as needed for hypoglycemia 08/23/20   Philemon Kingdom, MD  Insulin Lispro-aabc, 1 U Dial, (LYUMJEV KWIKPEN) 100 UNIT/ML SOPN Use as directed 08/23/20   Philemon Kingdom, MD  Insulin Pen Needle 32G X 4 MM MISC Use up to 5x a day as advised 08/24/20   Philemon Kingdom, MD  LANTUS SOLOSTAR 100 UNIT/ML Solostar Pen 34 Units in  the morning. 01/16/22   [provider]    Family History Family History  Problem Relation Age of Onset   Heart disease Paternal Grandfather     Social History Social History   Tobacco Use   Smoking status: Never   Smokeless tobacco: Never  Vaping Use   Vaping Use: Never used  Substance Use Topics   Alcohol use: Never   Drug use: No     Allergies   Patient has no known allergies.   Review of Systems Review of Systems  Eyes:  Positive for photophobia and visual disturbance.  Neurological:  Positive for headaches.     Physical Exam Triage Vital Signs ED Triage Vitals  Enc Vitals Group     BP 11/14/22 1042 117/65     Pulse Rate 11/14/22 1042 86     Resp 11/14/22 1042 16     Temp 11/14/22 1042 98.3 F (36.8 C)     Temp Source  11/14/22 1042 Oral     SpO2 11/14/22 1042 97 %     Weight --      Height --      Head Circumference --      Peak Flow --      Pain Score 11/14/22 1043 6     Pain Loc --      Pain Edu? --      Excl. in [place]? --    No data found.  Updated Vital Signs BP 117/65 (BP Location: Left Arm)   Pulse 86   Temp 98.3 F (36.8 C) (Oral)   Resp 16   SpO2 97%   Visual Acuity Right Eye Distance: 20/10 (With corrective lenses) Left Eye Distance: 20/13 (With corrective lenses) Bilateral Distance: 20/10 (With corrective lenses)  Right Eye Near:   Left Eye Near:    Bilateral Near:     Physical Exam Vitals and nursing note reviewed.  Constitutional:      General: He is not in acute distress.    Appearance: Normal appearance. He is not ill-appearing.  HENT:     Head: Normocephalic and atraumatic.     Right Ear: Tympanic membrane and ear canal normal.     Left Ear: Tympanic membrane and ear canal normal.     Nose: Nose normal.  Eyes:     General: Lids are normal.     Extraocular Movements: Extraocular movements intact.     Conjunctiva/sclera: Conjunctivae normal.     Pupils: Pupils are equal, round, and reactive to light.     Funduscopic exam:    Right eye: Red reflex present.        Left eye: Red reflex present. Cardiovascular:     Rate and Rhythm: Normal rate.  Pulmonary:     Effort: Pulmonary effort is normal.  Skin:    General: Skin is warm and dry.  Neurological:     General: No focal deficit present.     Mental Status: He is alert and oriented to person, place, and time.     GCS: GCS eye subscore is 4. GCS verbal subscore is 5. GCS motor subscore is 6.     Cranial Nerves: No facial asymmetry.     Motor: No weakness.     Coordination: Romberg sign negative. Coordination normal.  Psychiatric:        Mood and Affect: Mood normal.        Behavior: Behavior normal.      UC Treatments / Results  Labs (all labs ordered are  listed, but only abnormal results are  displayed) Labs Reviewed - No data to display  EKG   Radiology No results found.  Procedures Procedures (including critical care time)  Medications Ordered in UC Medications - No data to display  Initial Impression / Assessment and Plan / UC Course  I have reviewed the triage vital signs and the nursing notes.  Pertinent labs & imaging results that were available during my care of the patient were reviewed by me and considered in my medical decision making (see chart for details).     I reviewed exam and symptoms with patient.  I discussed limitations and abilities of urgent care.  Given reported worse headache of his life today I advised to go to the emergency room for further evaluation and treatment.  He is in agreement with plan and will go POV to the ER with his girlfriend driving.  I also advised to follow-up with ophthalmology and did give him contact information for ophthalmology on-call today.  Patient was instructed to pull over and call 911 for any worsening symptoms that occur in transit and he verbalized understanding. Final Clinical Impressions(s) / UC Diagnoses   Final diagnoses:  Acute intractable headache, unspecified headache type  Left eye pain     Discharge Instructions      Please go to the emergency room for further evaluation of your headache You should follow-up with ophthalmology given your history of diabetes   ED Prescriptions   None    PDMP not reviewed this encounter.   Melynda Ripple, NP 11/14/22 1122

## 2022-11-14 NOTE — ED Notes (Signed)
Patient has refused all services   he just wants the results of the scan

## 2022-11-14 NOTE — ED Notes (Signed)
Pt leaving the room walking by nurse's station stating "I'm leaving I have things to do". Pt unable to be talked into staying for tx. Pt refused to sign AMA form. Pt discharged accordingly.

## 2022-11-14 NOTE — ED Triage Notes (Signed)
Pt c/o L sided migraine and L eye pain ongoing for three weeks, and worsening over the last few days. Pt referred to ED from UC. Pt also reports L nare epistaxis every morning for the last few days upon waking

## 2022-11-14 NOTE — ED Triage Notes (Signed)
Pt states he is having difficulties keeping his left eye open, light sensitivity, area is sensitive to touch, and the patient states he has to focus vision to see out of left eye. The patient states he has been having nose bleeds (with eye pain)   Started: began worsening 3 days ago

## 2022-12-08 ENCOUNTER — Other Ambulatory Visit: Payer: Self-pay

## 2022-12-08 ENCOUNTER — Emergency Department (HOSPITAL_COMMUNITY): Payer: Commercial Managed Care - HMO

## 2022-12-08 ENCOUNTER — Emergency Department (HOSPITAL_COMMUNITY)
Admission: EM | Admit: 2022-12-08 | Discharge: 2022-12-08 | Disposition: A | Discharge status: Home / Self Care | Payer: Commercial Managed Care - HMO | Attending: Emergency Medicine | Admitting: Emergency Medicine

## 2022-12-08 ENCOUNTER — Encounter (HOSPITAL_COMMUNITY): Payer: Self-pay

## 2022-12-08 DIAGNOSIS — J101 Influenza due to other identified influenza virus with other respiratory manifestations: Secondary | ICD-10-CM | POA: Diagnosis not present

## 2022-12-08 DIAGNOSIS — Z1152 Encounter for screening for COVID-19: Secondary | ICD-10-CM | POA: Diagnosis not present

## 2022-12-08 DIAGNOSIS — Z794 Long term (current) use of insulin: Secondary | ICD-10-CM | POA: Diagnosis not present

## 2022-12-08 DIAGNOSIS — E109 Type 1 diabetes mellitus without complications: Secondary | ICD-10-CM | POA: Diagnosis not present

## 2022-12-08 DIAGNOSIS — R1110 Vomiting, unspecified: Secondary | ICD-10-CM | POA: Diagnosis present

## 2022-12-08 LAB — COMPREHENSIVE METABOLIC PANEL
ALT: 20 U/L (ref 0–44)
AST: 36 U/L (ref 15–41)
Albumin: 3.7 g/dL (ref 3.5–5.0)
Alkaline Phosphatase: 69 U/L (ref 38–126)
Anion gap: 10 (ref 5–15)
BUN: 15 mg/dL (ref 6–20)
CO2: 27 mmol/L (ref 22–32)
Calcium: 8.6 mg/dL — ABNORMAL LOW (ref 8.9–10.3)
Chloride: 104 mmol/L (ref 98–111)
Creatinine, Ser: 1.13 mg/dL (ref 0.61–1.24)
GFR, Estimated: >60 mL/min (ref >60)
Glucose, Bld: 131 mg/dL — ABNORMAL HIGH (ref 70–99)
Potassium: 3.1 mmol/L — ABNORMAL LOW (ref 3.5–5.1)
Sodium: 141 mmol/L (ref 135–145)
Total Bilirubin: 0.5 mg/dL (ref 0.3–1.2)
Total Protein: 6.6 g/dL (ref 6.5–8.1)

## 2022-12-08 LAB — URINALYSIS, ROUTINE W REFLEX MICROSCOPIC
Bilirubin Urine: NEGATIVE
Glucose, UA: 50 mg/dL — AB
Hgb urine dipstick: NEGATIVE
Ketones, ur: NEGATIVE mg/dL
Leukocytes,Ua: NEGATIVE
Nitrite: NEGATIVE
Protein, ur: NEGATIVE mg/dL
Specific Gravity, Urine: 1.02 (ref 1.005–1.030)
pH: 5 (ref 5.0–8.0)

## 2022-12-08 LAB — CBC WITH DIFFERENTIAL/PLATELET
Abs Immature Granulocytes: 0.01 10*3/uL (ref 0.00–0.07)
Basophils Absolute: 0 10*3/uL (ref 0.0–0.1)
Basophils Relative: 1 %
Eosinophils Absolute: 0 10*3/uL (ref 0.0–0.5)
Eosinophils Relative: 0 %
HCT: 41.4 % (ref 39.0–52.0)
Hemoglobin: 13 g/dL (ref 13.0–17.0)
Immature Granulocytes: 0 %
Lymphocytes Relative: 10 %
Lymphs Abs: 0.6 10*3/uL — ABNORMAL LOW (ref 0.7–4.0)
MCH: 27.5 pg (ref 26.0–34.0)
MCHC: 31.4 g/dL (ref 30.0–36.0)
MCV: 87.5 fL (ref 80.0–100.0)
Monocytes Absolute: 0.6 10*3/uL (ref 0.1–1.0)
Monocytes Relative: 10 %
Neutro Abs: 4.6 10*3/uL (ref 1.7–7.7)
Neutrophils Relative %: 79 %
Platelets: 173 10*3/uL (ref 150–400)
RBC: 4.73 MIL/uL (ref 4.22–5.81)
RDW: 13.5 % (ref 11.5–15.5)
WBC: 5.8 10*3/uL (ref 4.0–10.5)
nRBC: 0 % (ref 0.0–0.2)

## 2022-12-08 LAB — RESP PANEL BY RT-PCR (RSV, FLU A&B, COVID)  RVPGX2
Influenza A by PCR: NEGATIVE
Influenza B by PCR: POSITIVE — AB
Resp Syncytial Virus by PCR: NEGATIVE
SARS Coronavirus 2 by RT PCR: NEGATIVE

## 2022-12-08 LAB — CBG MONITORING, ED: Glucose-Capillary: 89 mg/dL (ref 70–99)

## 2022-12-08 LAB — LIPASE, BLOOD: Lipase: 34 U/L (ref 11–51)

## 2022-12-08 MED ORDER — ACETAMINOPHEN 500 MG PO TABS
1000.0000 mg | ORAL_TABLET | Freq: Once | ORAL | Status: AC
  Administered 2022-12-08: 1000 mg via ORAL
  Filled 2022-12-08: qty 2

## 2022-12-08 MED ORDER — LACTATED RINGERS IV BOLUS
1000.0000 mL | Freq: Once | INTRAVENOUS | Status: AC
  Administered 2022-12-08: 1000 mL via INTRAVENOUS

## 2022-12-08 MED ORDER — ONDANSETRON HCL 4 MG/2ML IJ SOLN
4.0000 mg | Freq: Once | INTRAMUSCULAR | Status: AC
  Administered 2022-12-08: 4 mg via INTRAVENOUS
  Filled 2022-12-08: qty 2

## 2022-12-08 MED ORDER — ONDANSETRON 8 MG PO TBDP: 8.0000 mg | ORAL_TABLET | Freq: Three times a day (TID) | ORAL | 0 refills | Status: AC | PRN

## 2022-12-08 MED ORDER — METOCLOPRAMIDE HCL 5 MG/ML IJ SOLN
10.0000 mg | Freq: Once | INTRAMUSCULAR | Status: AC
  Administered 2022-12-08: 10 mg via INTRAVENOUS
  Filled 2022-12-08: qty 2

## 2022-12-08 NOTE — Discharge Instructions (Addendum)
You have the flu this is a virus that should pass on its own.  Drink plenty of fluids, take the Zofran every 8 hours as needed for nausea.  Return to the ED if you have significant hyperglycemia hyperglycemia.

## 2022-12-08 NOTE — ED Provider Notes (Signed)
[facility] Provider Note   CSN: [id number] Arrival date & time: 12/08/22  1418     History  Chief Complaint  Patient presents with   Emesis    Alan Burke is a 32 y.o. male.   Emesis    Patient with medical history of type 1 diabetes presents to the emergency department due to emesis.  Patient symptoms started about a week ago, associated with generalized abdominal pain and diarrhea.  States a lot of his coworkers have been sick with [employer] [place] recently, patient has been having a intermittently productive cough.  Denies any chest pain or shortness of breath.  No previous abdominal surgeries, does not smoke any marijuana or do illicit drugs or alcohol.  The pain is constant, is worse when he has emesis but does not radiate elsewhere.  No medicine trial prior to arrival, states his glucose has been well-controlled but he is on a steroid taper which she started a week ago due to inflammation in his hands.  Home Medications Prior to Admission medications   Medication Sig Start Date End Date Taking? Authorizing Provider  ondansetron (ZOFRAN-ODT) 8 MG disintegrating tablet Take 1 tablet (8 mg total) by mouth every 8 (eight) hours as needed for nausea or vomiting. 12/08/22  Yes Sherrill Raring, PA-C  ADMELOG SOLOSTAR 100 UNIT/ML KwikPen Inject 8-12 Units into the skin 3 (three) times daily before meals. 04/10/22   Raspet, Derry Skill, PA-C  amoxicillin-clavulanate (AUGMENTIN) 875-125 MG tablet Take 1 tablet by mouth 2 (two) times daily. 08/20/22   Jaynee Eagles, PA-C  chlorhexidine (PERIDEX) 0.12 % solution Use as directed 15 mLs in the mouth or throat 2 (two) times daily. [id number]   Defelice, Jeanett Schlein, NP  diclofenac (VOLTAREN) 75 MG EC tablet Take 1 tablet (75 mg total) by mouth 2 (two) times daily. 04/10/22   Raspet, Derry Skill, PA-C  gabapentin (NEURONTIN) 300 MG capsule Take 1 capsule (300 mg total) by mouth 2 (two) times daily. 03/14/22   Kommor, Madison, MD   Glucagon 3 MG/DOSE POWD Place 3 mg into the nose once as needed for up to 1 dose. 08/23/20   Philemon Kingdom, MD  Glucagon, rDNA, (GLUCAGON EMERGENCY) 1 MG KIT Please inject im as needed for hypoglycemia 08/23/20   Philemon Kingdom, MD  Insulin Lispro-aabc, 1 U Dial, (LYUMJEV KWIKPEN) 100 UNIT/ML SOPN Use as directed 08/23/20   Philemon Kingdom, MD  Insulin Pen Needle 32G X 4 MM MISC Use up to 5x a day as advised 08/24/20   Philemon Kingdom, MD  LANTUS SOLOSTAR 100 UNIT/ML Solostar Pen 34 Units in the morning. 01/16/22   [provider]      Allergies    Patient has no known allergies.    Review of Systems   Review of Systems  Gastrointestinal:  Positive for vomiting.    Physical Exam Updated Vital Signs BP 138/74   Pulse 78   Temp 100.1 F (37.8 C) (Oral)   Resp 15   Ht '5\' 8"'$  (1.727 m)   Wt 84.8 kg   SpO2 99%   BMI 28.43 kg/m  Physical Exam Vitals and nursing note reviewed. Exam conducted with a chaperone present.  Constitutional:      Appearance: Normal appearance.  HENT:     Head: Normocephalic and atraumatic.  Eyes:     General: No scleral icterus.       Right eye: No discharge.        Left eye: No discharge.  Extraocular Movements: Extraocular movements intact.     Pupils: Pupils are equal, round, and reactive to light.  Cardiovascular:     Rate and Rhythm: Normal rate and regular rhythm.     Pulses: Normal pulses.     Heart sounds: Normal heart sounds.     No friction rub. No gallop.  Pulmonary:     Effort: Pulmonary effort is normal. No respiratory distress.     Breath sounds: Normal breath sounds.  Abdominal:     General: Abdomen is flat. Bowel sounds are normal. There is no distension.     Palpations: Abdomen is soft.     Tenderness: There is abdominal tenderness.  Skin:    General: Skin is warm and dry.     Coloration: Skin is not jaundiced.  Neurological:     Mental Status: He is alert. Mental status is at baseline.     Coordination:  Coordination normal.     ED Results / Procedures / Treatments   Labs (all labs ordered are listed, but only abnormal results are displayed) Labs Reviewed  RESP PANEL BY RT-PCR (RSV, FLU A&B, COVID)  RVPGX2 - Abnormal; Notable for the following components:      Result Value   Influenza B by PCR POSITIVE (*)    All other components within normal limits  CBC WITH DIFFERENTIAL/PLATELET - Abnormal; Notable for the following components:   Lymphs Abs 0.6 (*)    All other components within normal limits  COMPREHENSIVE METABOLIC PANEL - Abnormal; Notable for the following components:   Potassium 3.1 (*)    Glucose, Bld 131 (*)    Calcium 8.6 (*)    All other components within normal limits  URINALYSIS, ROUTINE W REFLEX MICROSCOPIC - Abnormal; Notable for the following components:   Glucose, UA 50 (*)    All other components within normal limits  LIPASE, BLOOD  CBG MONITORING, ED    EKG None  Radiology DG Chest Portable 1 View  Result Date: 12/08/2022 CLINICAL DATA:  Cough, vomiting, dizziness EXAM: PORTABLE CHEST 1 VIEW COMPARISON:  Chest x-ray dated 08/25/2021 FINDINGS: Heart size and mediastinal contours are within normal limits. Lungs are clear. No pleural effusion or pneumothorax is seen. Osseous structures about the chest are unremarkable. IMPRESSION: No active disease. No evidence of pneumonia or pulmonary edema. Electronically Signed   By: Franki Cabot M.D.   On: 12/08/2022 15:36    Procedures Procedures    Medications Ordered in ED Medications  lactated ringers bolus 1,000 mL (0 mLs Intravenous Stopped 12/08/22 1640)  ondansetron (ZOFRAN) injection 4 mg (4 mg Intravenous Given 12/08/22 1447)  metoCLOPramide (REGLAN) injection 10 mg (10 mg Intravenous Given 12/08/22 1614)  acetaminophen (TYLENOL) tablet 1,000 mg (1,000 mg Oral Given 12/08/22 1624)    ED Course/ Medical Decision Making/ A&P                             Medical Decision Making Amount and/or Complexity of  Data Reviewed Labs: ordered. Radiology: ordered.  Risk OTC drugs. Prescription drug management.   Patient presents to the emergency department due to abdominal pain.  Differential includes URI, dehydration, AKI, DKA, HHS, electrolyte derangement.  Abdomen is soft but has generalized tenderness, moist mucous membranes.  Regular rate and rhythm, lungs are clear to auscultation.  Chest x-ray is negative for pneumonia, patient does have influenza per viral panel.  There is no gross electrolyte derangement, AKI, DKA, HHS.  Lipase within  normal limits, not consistent with pancreatitis.  No leukocytosis.  I ordered fluids, Zofran, Reglan, Tylenol please patient had a fever on reevaluation.  Tolerating p.o., will discharge home with Zofran.  Suspect symptoms are secondary to influenza, return precautions discussed able for outpatient follow-up.        Final Clinical Impression(s) / ED Diagnoses Final diagnoses:  Influenza B    Rx / DC Orders ED Discharge Orders          Ordered    ondansetron (ZOFRAN-ODT) 8 MG disintegrating tablet  Every 8 hours PRN        12/08/22 1623              Sherrill Raring, PA-C 12/08/22 1651    Ezequiel Essex, MD 12/08/22 1655

## 2022-12-08 NOTE — ED Triage Notes (Signed)
Patient said he has been vomiting for 3 days. No abdominal pain. Feeling dizzy.

## 2022-12-10 ENCOUNTER — Telehealth: Payer: Self-pay

## 2022-12-10 ENCOUNTER — Telehealth: Payer: Self-pay | Admitting: Licensed Clinical Social Worker

## 2022-12-10 NOTE — Patient Outreach (Signed)
Transition Care Management Unsuccessful Follow-up Telephone Call  Date of discharge and from where:  12/08/22 from [facility]  Attempts:  1st Attempt  Reason for unsuccessful TCM follow-up call:  Unable to leave message

## 2022-12-10 NOTE — Telephone Encounter (Signed)
Mychart msg sent

## 2023-05-25 IMAGING — CR DG HIP (WITH OR WITHOUT PELVIS) 2-3V*R*
3 series · 3 of 3 positions shown · non-contrast
Comparison: None.

CLINICAL DATA: Pain following fall

EXAM:
DG HIP (WITH OR WITHOUT PELVIS) 2-3V RIGHT

[t pelvis ap]
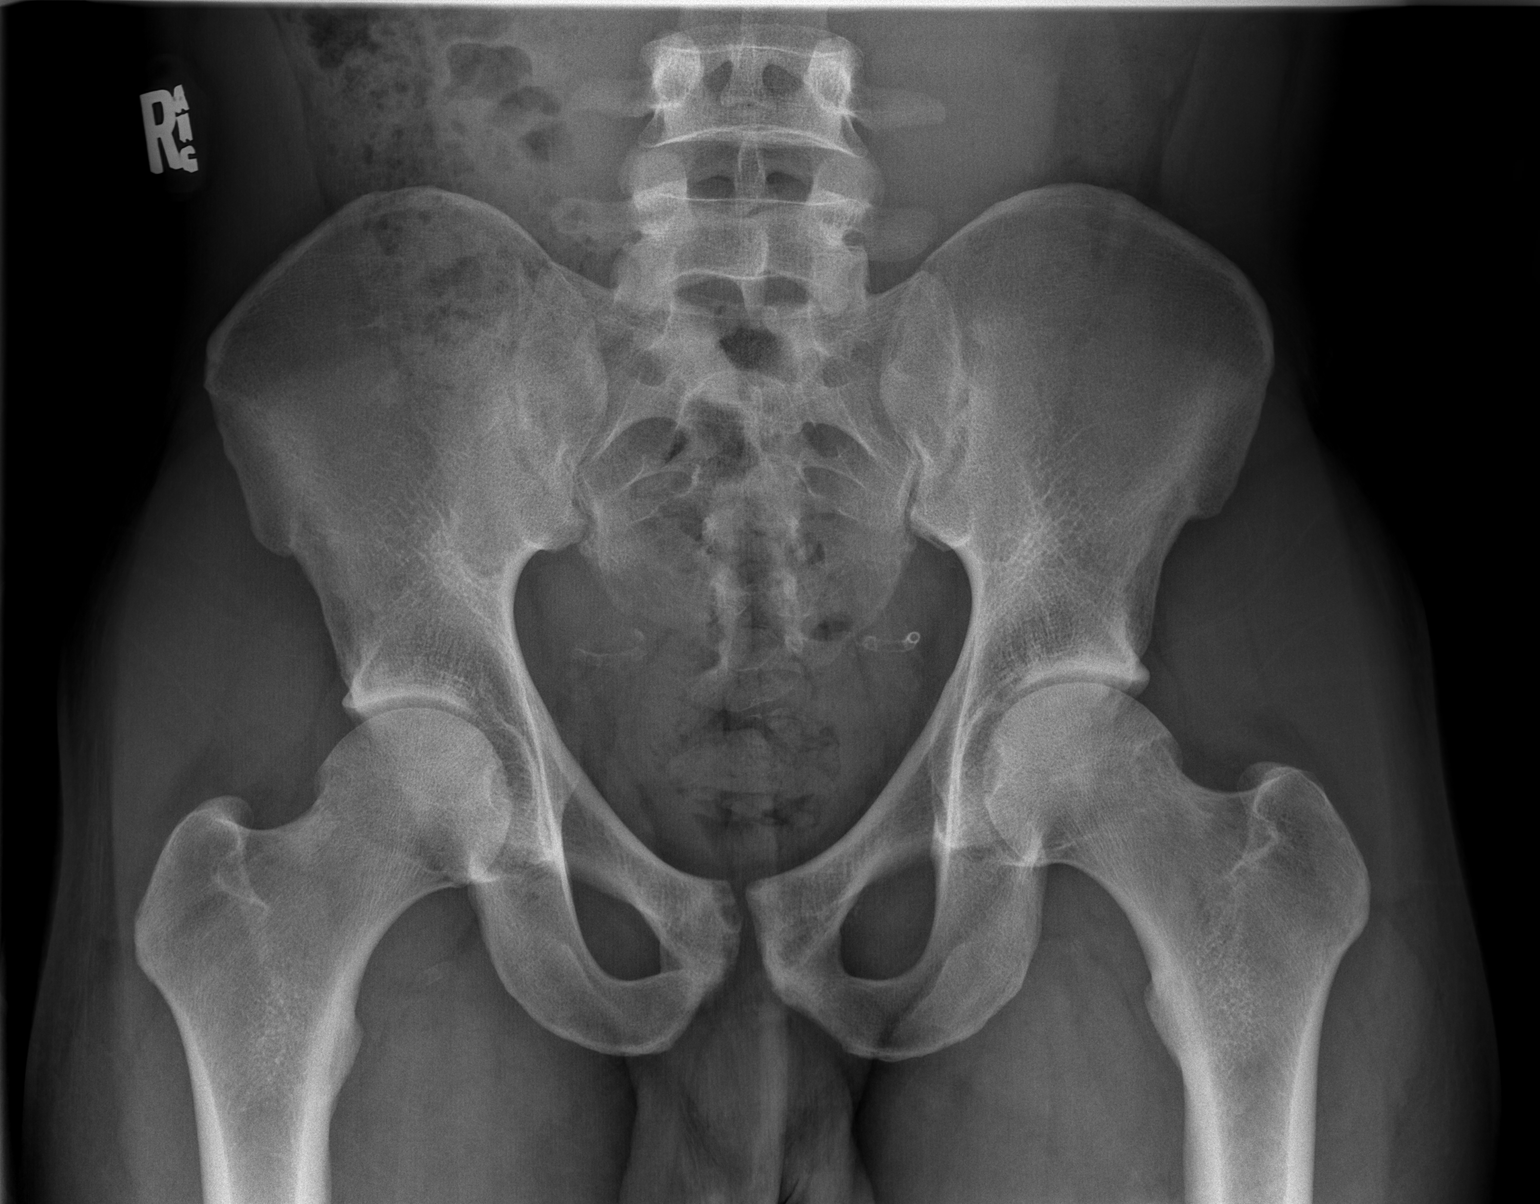

[t hip ap right]
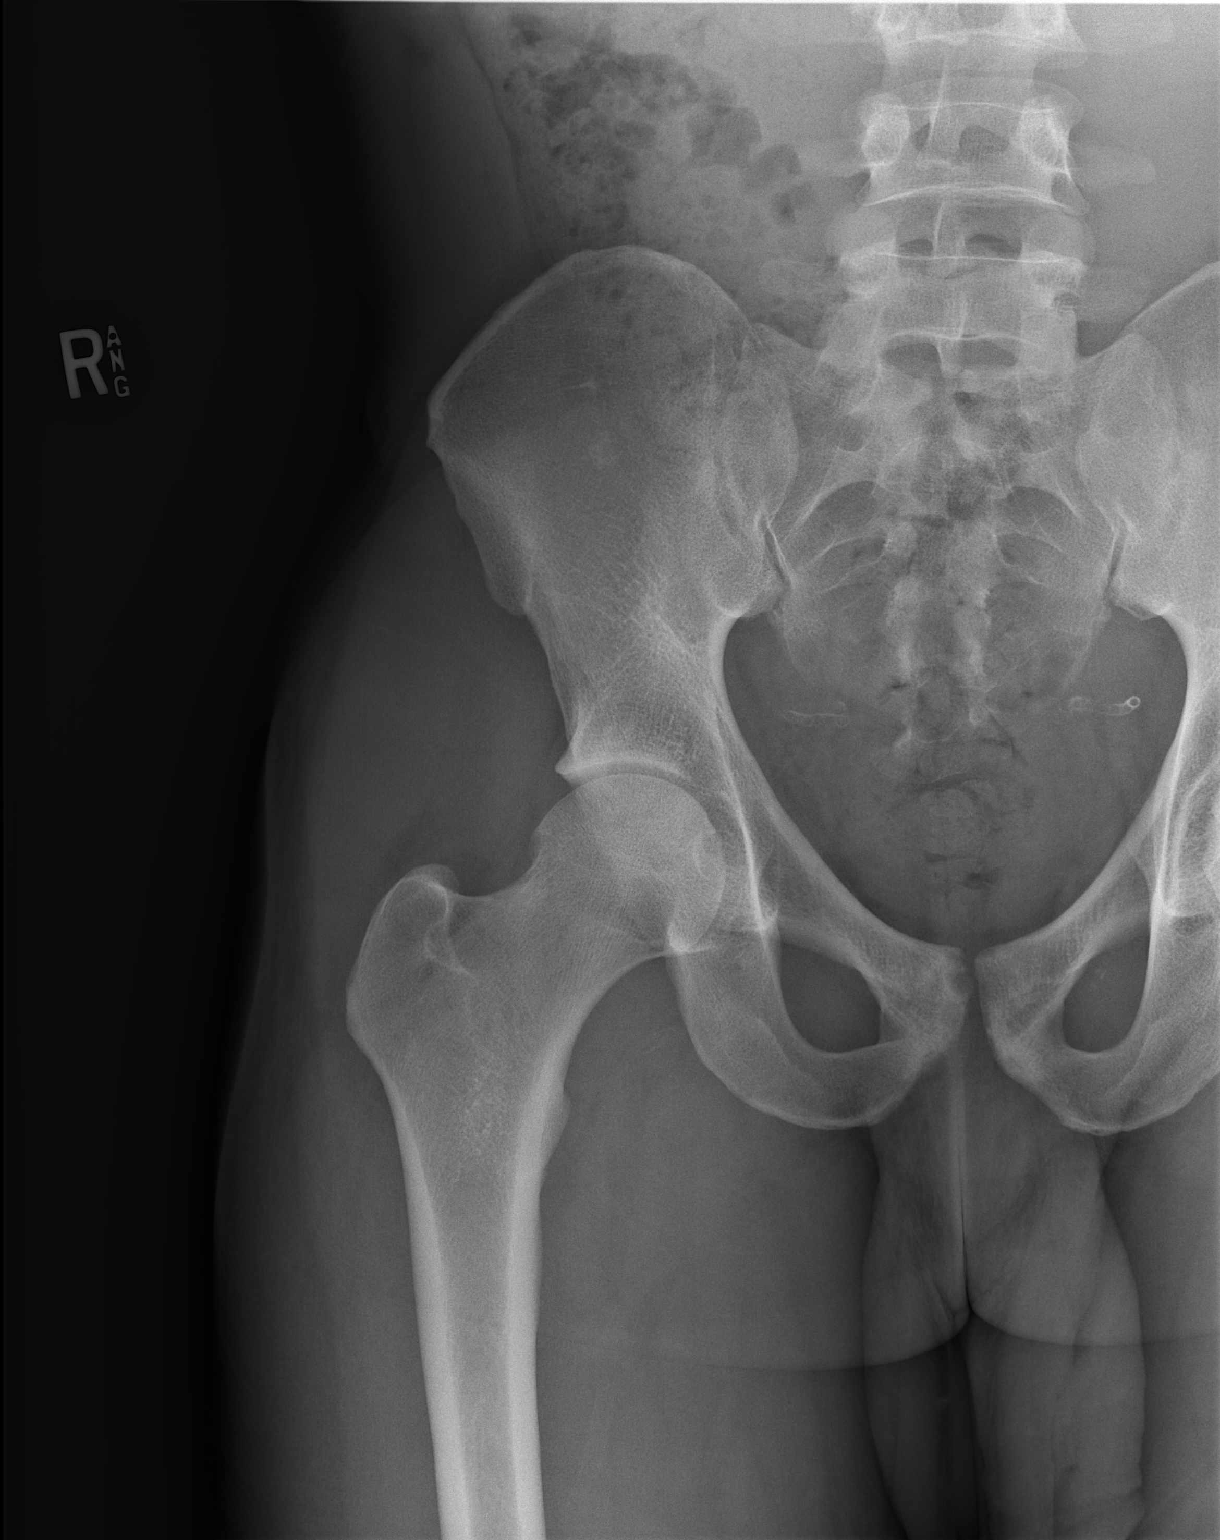

[t hip frog leg right]
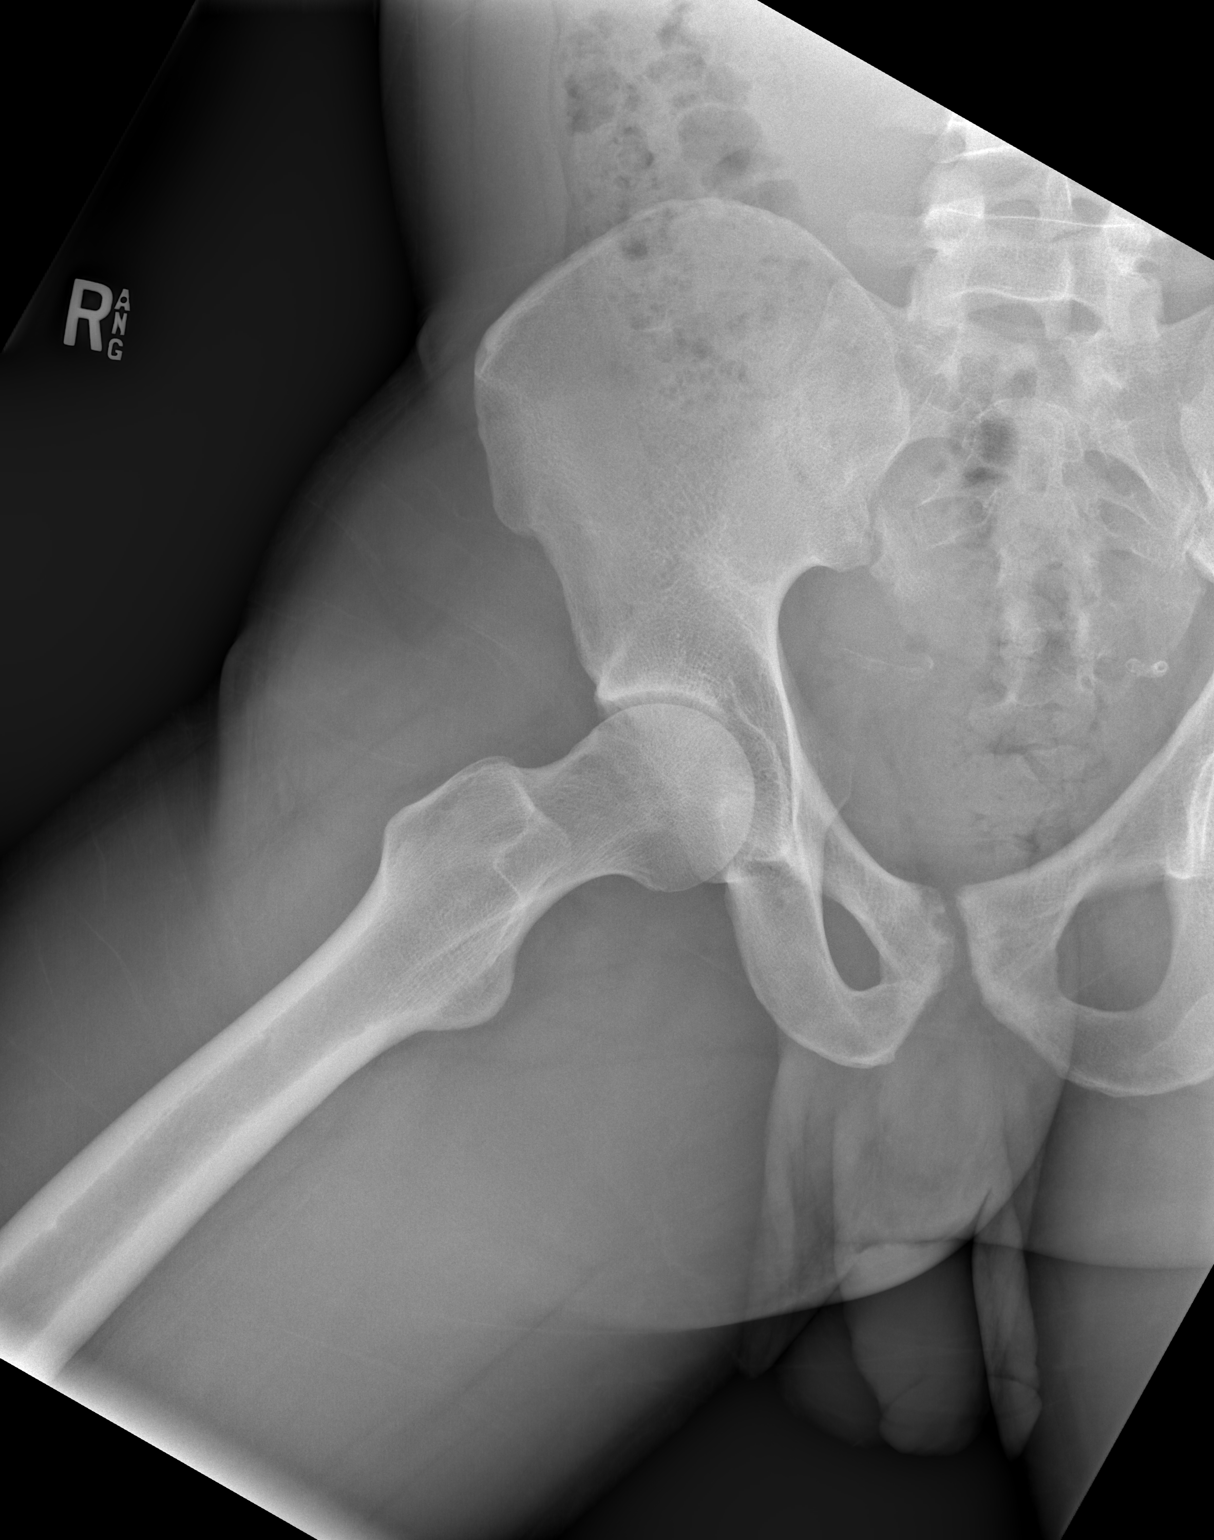

[3 of 3 positions shown; findings below may reference images not displayed]

FINDINGS: Frontal pelvis as well as frontal and lateral right hip images
obtained. No appreciable fracture or dislocation. No appreciable
joint space narrowing. Note that there is mild symmetric bony
overgrowth along each superolateral acetabulum. Sacroiliac joints
appear normal bilaterally
IMPRESSION: No fracture or dislocation. No appreciable joint space narrowing.
Slight bony overgrowth along each superolateral acetabulum is
symmetric and potentially places patient at increased risk for
femoroacetabular impingement.

## 2023-08-15 ENCOUNTER — Emergency Department (HOSPITAL_BASED_OUTPATIENT_CLINIC_OR_DEPARTMENT_OTHER)
Admission: EM | Admit: 2023-08-15 | Discharge: 2023-08-15 | Disposition: A | Discharge status: Home / Self Care | Payer: Commercial Managed Care - HMO | Attending: Emergency Medicine | Admitting: Emergency Medicine

## 2023-08-15 ENCOUNTER — Encounter (HOSPITAL_BASED_OUTPATIENT_CLINIC_OR_DEPARTMENT_OTHER): Payer: Self-pay

## 2023-08-15 ENCOUNTER — Emergency Department (HOSPITAL_BASED_OUTPATIENT_CLINIC_OR_DEPARTMENT_OTHER): Payer: Commercial Managed Care - HMO

## 2023-08-15 DIAGNOSIS — H5712 Ocular pain, left eye: Secondary | ICD-10-CM | POA: Insufficient documentation

## 2023-08-15 DIAGNOSIS — E1165 Type 2 diabetes mellitus with hyperglycemia: Secondary | ICD-10-CM | POA: Insufficient documentation

## 2023-08-15 DIAGNOSIS — H538 Other visual disturbances: Secondary | ICD-10-CM | POA: Diagnosis not present

## 2023-08-15 DIAGNOSIS — Z794 Long term (current) use of insulin: Secondary | ICD-10-CM | POA: Insufficient documentation

## 2023-08-15 DIAGNOSIS — R519 Headache, unspecified: Secondary | ICD-10-CM | POA: Insufficient documentation

## 2023-08-15 LAB — CBG MONITORING, ED: Glucose-Capillary: 203 mg/dL — ABNORMAL HIGH (ref 70–99)

## 2023-08-15 MED ORDER — KETOROLAC TROMETHAMINE 15 MG/ML IJ SOLN
15.0000 mg | Freq: Once | INTRAMUSCULAR | Status: AC
  Administered 2023-08-15: 15 mg via INTRAVENOUS
  Filled 2023-08-15: qty 1

## 2023-08-15 MED ORDER — TETRACAINE HCL 0.5 % OP SOLN
2.0000 [drp] | Freq: Once | OPHTHALMIC | Status: AC
  Administered 2023-08-15: 2 [drp] via OPHTHALMIC
  Filled 2023-08-15: qty 4

## 2023-08-15 MED ORDER — METOCLOPRAMIDE HCL 5 MG/ML IJ SOLN
10.0000 mg | Freq: Once | INTRAMUSCULAR | Status: AC
  Administered 2023-08-15: 10 mg via INTRAVENOUS
  Filled 2023-08-15: qty 2

## 2023-08-15 MED ORDER — ACETAMINOPHEN 500 MG PO TABS
1000.0000 mg | ORAL_TABLET | Freq: Once | ORAL | Status: AC
  Administered 2023-08-15: 1000 mg via ORAL
  Filled 2023-08-15: qty 2

## 2023-08-15 MED ORDER — OFLOXACIN 0.3 % OP SOLN: 1.0000 [drp] | Freq: Four times a day (QID) | OPHTHALMIC | 0 refills | Status: AC

## 2023-08-15 MED ORDER — FLUORESCEIN SODIUM 1 MG OP STRP
1.0000 | ORAL_STRIP | Freq: Once | OPHTHALMIC | Status: AC
  Administered 2023-08-15: 1 via OPHTHALMIC
  Filled 2023-08-15: qty 1

## 2023-08-15 NOTE — Discharge Instructions (Signed)
Were seen in the emergency department for your headache and your eye pain.  There is no abnormalities on your head CT scan within your brain or your eye and your normal eye pressures and no obvious injury to your eye.  We have given you an antibiotic eyedrop that you can use for the next 5 days.  You should follow-up with your ophthalmologist and I have given you a referral for neurology for headaches.  You can continue to take Tylenol and Motrin as needed for headache they can be taken up to every 6 hours as needed.  Do not take ibuprofen for more than 5 days in a row to prevent rebound headaches.  You should return to the emergency department if you are losing vision in your eye, you are having fevers, you have numbness or weakness on 1 side the body compared to the other or if you have any other new or concerning symptoms.

## 2023-08-15 NOTE — ED Notes (Signed)
Pt discharged home after verbalizing understanding of discharge instructions; nad noted. 

## 2023-08-15 NOTE — ED Triage Notes (Signed)
He c/o some chronic issues with his left eye. He tells me he is a type I diabetic. He has seen ophthalmology in the past, who told him he may "someday have problems with his eye(s). His current c/o is of left eye pain and excessive tearing.

## 2023-08-15 NOTE — ED Notes (Signed)
Patient transported to CT 

## 2023-08-15 NOTE — ED Notes (Signed)
Pt frm home with left eye pain x 4 days. Pt had similar pain in Feb and was given drops; states it came back and it is worse - giving him a headache, also has drainage and he is unable to keep the eye open. Pt alert & oriented, nad noted.

## 2023-08-15 NOTE — ED Provider Notes (Signed)
[place] EMERGENCY DEPARTMENT AT [facility] Provider Note   CSN: [id number] Arrival date & time: 08/15/23  1803     History  Chief Complaint  Patient presents with   Eye Problem    Alan Burke is a 32 y.o. male.  Patient is a 32 year old male with a past medical history of diabetes presenting to the emergency department with left eye pain and headache.  The patient states that he has had eye pain and headaches ongoing for several months.  He states that he was seen by ophthalmology in February and was started on eyedrops.  He states that he usually gets a headache and eye pain every day and usually will resolve on its own within an hour however today the pain has persisted throughout the entire day.  He states that his vision feels blurry in his left eye.  He does endorse light sensitivity.  He denies any double vision.  He denies any numbness or weakness, nausea or vomiting.  The history is provided by the patient.  Eye Problem      Home Medications Prior to Admission medications   Medication Sig Start Date End Date Taking? Authorizing Provider  ofloxacin (OCUFLOX) 0.3 % ophthalmic solution Place 1 drop into the left eye 4 (four) times daily for 5 days. 08/15/23 08/20/23 Yes Kingsley, Benetta Spar K, DO  ADMELOG SOLOSTAR 100 UNIT/ML KwikPen Inject 8-12 Units into the skin 3 (three) times daily before meals. 04/10/22   Raspet, Noberto Retort, PA-C  amoxicillin-clavulanate (AUGMENTIN) 875-125 MG tablet Take 1 tablet by mouth 2 (two) times daily. 08/20/22   Wallis Bamberg, PA-C  chlorhexidine (PERIDEX) 0.12 % solution Use as directed 15 mLs in the mouth or throat 2 (two) times daily. 05/29/22   Defelice, Para March, NP  diclofenac (VOLTAREN) 75 MG EC tablet Take 1 tablet (75 mg total) by mouth 2 (two) times daily. 04/10/22   Raspet, Noberto Retort, PA-C  gabapentin (NEURONTIN) 300 MG capsule Take 1 capsule (300 mg total) by mouth 2 (two) times daily. 03/14/22   Kommor, Madison, MD  Glucagon 3 MG/DOSE  POWD Place 3 mg into the nose once as needed for up to 1 dose. 08/23/20   Carlus Pavlov, MD  Glucagon, rDNA, (GLUCAGON EMERGENCY) 1 MG KIT Please inject im as needed for hypoglycemia 08/23/20   Carlus Pavlov, MD  Insulin Lispro-aabc, 1 U Dial, (LYUMJEV KWIKPEN) 100 UNIT/ML SOPN Use as directed 08/23/20   Carlus Pavlov, MD  Insulin Pen Needle 32G X 4 MM MISC Use up to 5x a day as advised 08/24/20   Carlus Pavlov, MD  LANTUS SOLOSTAR 100 UNIT/ML Solostar Pen 34 Units in the morning. 01/16/22   [provider]  ondansetron (ZOFRAN-ODT) 8 MG disintegrating tablet Take 1 tablet (8 mg total) by mouth every 8 (eight) hours as needed for nausea or vomiting. 12/08/22   Theron Arista, PA-C      Allergies    Patient has no known allergies.    Review of Systems   Review of Systems  Physical Exam Updated Vital Signs BP (!) 123/98   Pulse 73   Resp 20   SpO2 99%  Physical Exam Vitals and nursing note reviewed.  Constitutional:      General: He is not in acute distress.    Appearance: Normal appearance.  HENT:     Head: Normocephalic and atraumatic.     Nose: Nose normal.     Mouth/Throat:     Mouth: Mucous membranes are moist.  Pharynx: Oropharynx is clear.  Eyes:     Extraocular Movements: Extraocular movements intact.     Conjunctiva/sclera: Conjunctivae normal.     Pupils: Pupils are equal, round, and reactive to light.  Cardiovascular:     Rate and Rhythm: Normal rate and regular rhythm.     Heart sounds: Normal heart sounds.  Pulmonary:     Effort: Pulmonary effort is normal.     Breath sounds: Normal breath sounds.  Abdominal:     General: Abdomen is flat.     Palpations: Abdomen is soft.     Tenderness: There is no abdominal tenderness.  Musculoskeletal:        General: Normal range of motion.     Cervical back: Normal range of motion.  Skin:    General: Skin is warm and dry.  Neurological:     General: No focal deficit present.     Mental Status: He  is alert and oriented to person, place, and time.     Cranial Nerves: No cranial nerve deficit.     Sensory: No sensory deficit.     Motor: No weakness.     Coordination: Coordination normal.  Psychiatric:        Mood and Affect: Mood normal.        Behavior: Behavior normal.     ED Results / Procedures / Treatments   Labs (all labs ordered are listed, but only abnormal results are displayed) Labs Reviewed  CBG MONITORING, ED - Abnormal; Notable for the following components:      Result Value   Glucose-Capillary 203 (*)    All other components within normal limits    EKG None  Radiology CT Head Wo Contrast  Result Date: 08/15/2023 CLINICAL DATA:  Headache, increasing frequency or severity EXAM: CT HEAD WITHOUT CONTRAST TECHNIQUE: Contiguous axial images were obtained from the base of the skull through the vertex without intravenous contrast. RADIATION DOSE REDUCTION: This exam was performed according to the departmental dose-optimization program which includes automated exposure control, adjustment of the mA and/or kV according to patient size and/or use of iterative reconstruction technique. COMPARISON:  CT head 11/15/20 FINDINGS: Brain: No hemorrhage. No hydrocephalus. No extra-axial fluid collection. No CT evidence of an acute cortical infarct. No mass effect. No mass lesion. Vascular: No hyperdense vessel or unexpected calcification. Skull: Normal. Negative for fracture or focal lesion. Sinuses/Orbits: No middle ear or mastoid effusion. Paranasal sinuses are clear. Orbits are unremarkable. Other: None. IMPRESSION: No acute intracranial abnormality. Electronically Signed   By: Lorenza Cambridge M.D.   On: 08/15/2023 20:54    Procedures Procedures    Medications Ordered in ED Medications  fluorescein ophthalmic strip 1 strip (1 strip Both Eyes Given by Other 08/15/23 2116)  tetracaine (PONTOCAINE) 0.5 % ophthalmic solution 2 drop (2 drops Both Eyes Given by Other 08/15/23 2116)   ketorolac (TORADOL) 15 MG/ML injection 15 mg (15 mg Intravenous Given 08/15/23 2041)  metoCLOPramide (REGLAN) injection 10 mg (10 mg Intravenous Given 08/15/23 2040)  acetaminophen (TYLENOL) tablet 1,000 mg (1,000 mg Oral Given 08/15/23 2043)    ED Course/ Medical Decision Making/ A&P Clinical Course as of 08/15/23 2147  Thu Aug 15, 2023  2101 No acute abnormality on CT head.  No fluorescein uptake on eye exam, right eye pressure 20 left eye pressure 15 making acute angle-closure glaucoma unlikely.  The patient headaches seem to be more related to his eye pain and will be recommended outpatient ophthalmology follow-up. [VK]  2143 Upon reassessment,  patient's headache and eye pain has improved.  He is stable for discharge home.  He was recommended to follow-up with his ophthalmologist and will additionally be given neurology follow-up.  He was given strict return precautions. [VK]    Clinical Course User Index [VK] Rexford Maus, DO                                 Medical Decision Making This patient presents to the ED with chief complaint(s) of headaches, eye pain with pertinent past medical history of diabetes which further complicates the presenting complaint. The complaint involves an extensive differential diagnosis and also carries with it a high risk of complications and morbidity.    The differential diagnosis includes migraine headache, tension headache, acute angle-closure glaucoma, foreign body, ICH, mass effect, no fever or pain with extraocular motions making an orbital cellulitis unlikely  Additional history obtained: Additional history obtained from N/A Records reviewed previous ED records  ED Course and Reassessment: On patient's arrival he is hemodynamically stable in no acute distress.  The patient has no obvious abnormality to his eye externally and will have formal eye exam with fluorescein staining and eye pressures as well as visual acuity.  Due to the  chronicity of his headaches he will have a CT of his head.  He will be treated with a migraine cocktail and will be closely reassessed.  Independent labs interpretation:  The following labs were independently interpreted: Hyperglycemia  Independent visualization of imaging: - I independently visualized the following imaging with scope of interpretation limited to determining acute life threatening conditions related to emergency care: CT head, which revealed no acute disease  Consultation: - Consulted or discussed management/test interpretation w/ external professional: N/A  Consideration for admission or further workup: Patient has no emergent conditions requiring admission or further work-up at this time and is stable for discharge home with primary care follow-up  Social Determinants of health: N/A    Amount and/or Complexity of Data Reviewed Radiology: ordered.  Risk OTC drugs. Prescription drug management.          Final Clinical Impression(s) / ED Diagnoses Final diagnoses:  Left eye pain  Increased frequency of headaches    Rx / DC Orders ED Discharge Orders          Ordered    ofloxacin (OCUFLOX) 0.3 % ophthalmic solution  4 times daily        08/15/23 2145    Ambulatory referral to Neurology       Comments: An appointment is requested in approximately: 4 weeks   08/15/23 2146              Rexford Maus, DO 08/15/23 2147

## 2023-09-28 ENCOUNTER — Emergency Department (HOSPITAL_BASED_OUTPATIENT_CLINIC_OR_DEPARTMENT_OTHER): Payer: Commercial Managed Care - HMO | Admitting: Radiology

## 2023-09-28 ENCOUNTER — Encounter (HOSPITAL_BASED_OUTPATIENT_CLINIC_OR_DEPARTMENT_OTHER): Payer: Self-pay

## 2023-09-28 DIAGNOSIS — Z794 Long term (current) use of insulin: Secondary | ICD-10-CM | POA: Insufficient documentation

## 2023-09-28 DIAGNOSIS — M25562 Pain in left knee: Secondary | ICD-10-CM | POA: Diagnosis not present

## 2023-09-28 DIAGNOSIS — Y92410 Unspecified street and highway as the place of occurrence of the external cause: Secondary | ICD-10-CM | POA: Insufficient documentation

## 2023-09-28 DIAGNOSIS — R0789 Other chest pain: Secondary | ICD-10-CM | POA: Diagnosis not present

## 2023-09-28 DIAGNOSIS — M542 Cervicalgia: Secondary | ICD-10-CM | POA: Insufficient documentation

## 2023-09-28 DIAGNOSIS — S61411A Laceration without foreign body of right hand, initial encounter: Secondary | ICD-10-CM | POA: Diagnosis present

## 2023-09-28 DIAGNOSIS — Z5329 Procedure and treatment not carried out because of patient's decision for other reasons: Secondary | ICD-10-CM | POA: Insufficient documentation
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
## 2023-09-29 ENCOUNTER — Emergency Department (HOSPITAL_BASED_OUTPATIENT_CLINIC_OR_DEPARTMENT_OTHER)
Admission: EM | Admit: 2023-09-29 | Discharge: 2023-09-29 | Discharge status: Against Medical Advice | Payer: Commercial Managed Care - HMO | Attending: Emergency Medicine | Admitting: Emergency Medicine

## 2023-09-29 ENCOUNTER — Emergency Department (HOSPITAL_BASED_OUTPATIENT_CLINIC_OR_DEPARTMENT_OTHER): Payer: Commercial Managed Care - HMO

## 2023-09-29 DIAGNOSIS — T07XXXA Unspecified multiple injuries, initial encounter: Secondary | ICD-10-CM

## 2023-09-29 LAB — CBC WITH DIFFERENTIAL/PLATELET
Abs Immature Granulocytes: 0.01 10*3/uL (ref 0.00–0.07)
Basophils Absolute: 0 10*3/uL (ref 0.0–0.1)
Basophils Relative: 1 %
Eosinophils Absolute: 0.2 10*3/uL (ref 0.0–0.5)
Eosinophils Relative: 3 %
HCT: 40.8 % (ref 39.0–52.0)
Hemoglobin: 13.7 g/dL (ref 13.0–17.0)
Immature Granulocytes: 0 %
Lymphocytes Relative: 34 %
Lymphs Abs: 2 10*3/uL (ref 0.7–4.0)
MCH: 28 pg (ref 26.0–34.0)
MCHC: 33.6 g/dL (ref 30.0–36.0)
MCV: 83.4 fL (ref 80.0–100.0)
Monocytes Absolute: 0.6 10*3/uL (ref 0.1–1.0)
Monocytes Relative: 10 %
Neutro Abs: 3.1 10*3/uL (ref 1.7–7.7)
Neutrophils Relative %: 52 %
Platelets: 186 10*3/uL (ref 150–400)
RBC: 4.89 MIL/uL (ref 4.22–5.81)
RDW: 13.5 % (ref 11.5–15.5)
WBC: 5.8 10*3/uL (ref 4.0–10.5)
nRBC: 0 % (ref 0.0–0.2)

## 2023-09-29 LAB — BASIC METABOLIC PANEL
Anion gap: 7 (ref 5–15)
BUN: 17 mg/dL (ref 6–20)
CO2: 27 mmol/L (ref 22–32)
Calcium: 9.6 mg/dL (ref 8.9–10.3)
Chloride: 103 mmol/L (ref 98–111)
Creatinine, Ser: 0.95 mg/dL (ref 0.61–1.24)
GFR, Estimated: >60 mL/min (ref >60)
Glucose, Bld: 118 mg/dL — ABNORMAL HIGH (ref 70–99)
Potassium: 5.6 mmol/L — ABNORMAL HIGH (ref 3.5–5.1)
Sodium: 137 mmol/L (ref 135–145)

## 2023-09-29 LAB — TROPONIN I (HIGH SENSITIVITY)
Troponin I (High Sensitivity): 2 ng/L (ref <18)
Troponin I (High Sensitivity): 2 ng/L (ref <18)

## 2023-09-29 LAB — POTASSIUM: Potassium: 3.5 mmol/L (ref 3.5–5.1)

## 2023-09-29 MED ORDER — IBUPROFEN 800 MG PO TABS
800.0000 mg | ORAL_TABLET | Freq: Once | ORAL | Status: AC
  Administered 2023-09-29: 800 mg via ORAL
  Filled 2023-09-29: qty 1

## 2023-09-29 MED ORDER — IOHEXOL 300 MG/ML  SOLN
100.0000 mL | Freq: Once | INTRAMUSCULAR | Status: AC | PRN
  Administered 2023-09-29: 100 mL via INTRAVENOUS
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
## 2023-09-29 NOTE — ED Notes (Signed)
Pt adamant about leaving. Pt was made aware of elevated potassium and the risks. MD made aware

## 2023-09-30 LAB — CBG MONITORING, ED: Glucose-Capillary: 111 mg/dL — ABNORMAL HIGH (ref 70–99)

## 2023-11-06 ENCOUNTER — Ambulatory Visit: Payer: Self-pay | Admitting: Nurse Practitioner

## 2023-11-21 DIAGNOSIS — M25441 Effusion, right hand: Secondary | ICD-10-CM | POA: Diagnosis not present

## 2023-11-21 DIAGNOSIS — E139 Other specified diabetes mellitus without complications: Secondary | ICD-10-CM | POA: Diagnosis not present

## 2023-11-21 DIAGNOSIS — Z1321 Encounter for screening for nutritional disorder: Secondary | ICD-10-CM | POA: Diagnosis not present

## 2023-11-21 DIAGNOSIS — R030 Elevated blood-pressure reading, without diagnosis of hypertension: Secondary | ICD-10-CM | POA: Diagnosis not present

## 2023-12-04 ENCOUNTER — Ambulatory Visit: Payer: Self-pay | Admitting: Nurse Practitioner

## 2023-12-05 DIAGNOSIS — K120 Recurrent oral aphthae: Secondary | ICD-10-CM | POA: Diagnosis not present

## 2023-12-05 DIAGNOSIS — Z202 Contact with and (suspected) exposure to infections with a predominantly sexual mode of transmission: Secondary | ICD-10-CM | POA: Diagnosis not present

## 2023-12-19 DIAGNOSIS — E113299 Type 2 diabetes mellitus with mild nonproliferative diabetic retinopathy without macular edema, unspecified eye: Secondary | ICD-10-CM | POA: Diagnosis not present

## 2024-01-28 DIAGNOSIS — E139 Other specified diabetes mellitus without complications: Secondary | ICD-10-CM | POA: Diagnosis not present

## 2024-01-28 DIAGNOSIS — Z01810 Encounter for preprocedural cardiovascular examination: Secondary | ICD-10-CM | POA: Diagnosis not present

## 2024-01-28 DIAGNOSIS — Z01818 Encounter for other preprocedural examination: Secondary | ICD-10-CM | POA: Diagnosis not present

## 2024-01-29 DIAGNOSIS — R9431 Abnormal electrocardiogram [ECG] [EKG]: Secondary | ICD-10-CM | POA: Diagnosis not present

## 2024-02-18 DIAGNOSIS — E139 Other specified diabetes mellitus without complications: Secondary | ICD-10-CM | POA: Diagnosis not present

## 2024-02-20 DIAGNOSIS — G5603 Carpal tunnel syndrome, bilateral upper limbs: Secondary | ICD-10-CM | POA: Diagnosis not present

## 2024-03-18 DIAGNOSIS — G5601 Carpal tunnel syndrome, right upper limb: Secondary | ICD-10-CM | POA: Diagnosis not present

## 2024-03-26 DIAGNOSIS — Z9889 Other specified postprocedural states: Secondary | ICD-10-CM | POA: Diagnosis not present

## 2024-03-26 DIAGNOSIS — E139 Other specified diabetes mellitus without complications: Secondary | ICD-10-CM | POA: Diagnosis not present

## 2024-03-26 DIAGNOSIS — Z113 Encounter for screening for infections with a predominantly sexual mode of transmission: Secondary | ICD-10-CM | POA: Diagnosis not present

## 2024-04-02 DIAGNOSIS — G5603 Carpal tunnel syndrome, bilateral upper limbs: Secondary | ICD-10-CM | POA: Diagnosis not present

## 2024-04-23 DIAGNOSIS — S6991XA Unspecified injury of right wrist, hand and finger(s), initial encounter: Secondary | ICD-10-CM | POA: Diagnosis not present

## 2024-05-10 ENCOUNTER — Encounter (HOSPITAL_COMMUNITY): Payer: Self-pay

## 2024-05-10 ENCOUNTER — Ambulatory Visit (HOSPITAL_COMMUNITY)
Admission: EM | Admit: 2024-05-10 | Discharge: 2024-05-10 | Disposition: A | Discharge status: Home / Self Care | Attending: Internal Medicine | Admitting: Internal Medicine

## 2024-05-10 DIAGNOSIS — Z113 Encounter for screening for infections with a predominantly sexual mode of transmission: Secondary | ICD-10-CM

## 2024-05-10 DIAGNOSIS — R369 Urethral discharge, unspecified: Secondary | ICD-10-CM | POA: Diagnosis not present

## 2024-05-10 LAB — POCT URINALYSIS DIP (MANUAL ENTRY)
Bilirubin, UA: NEGATIVE
Blood, UA: NEGATIVE
Glucose, UA: 100 mg/dL — AB
Ketones, POC UA: NEGATIVE mg/dL
Nitrite, UA: NEGATIVE
Protein Ur, POC: NEGATIVE mg/dL
Spec Grav, UA: 1.025 (ref 1.010–1.025)
Urobilinogen, UA: 0.2 U/dL
pH, UA: 6 (ref 5.0–8.0)

## 2024-05-10 NOTE — ED Provider Notes (Addendum)
 [facility]    CSN: [id number] Arrival date & time: 05/10/24  1453      History   Chief Complaint Chief Complaint  Patient presents with   Penile Discharge    HPI Alan Burke is a 33 y.o. male.   33 year old male who presents urgent care with complaints of penile discharge.  This just started in the last 24 hours.  He denies any pain in the penis, testicular pain, dysuria, hematuria.  He does have diabetes and has some frequent urination associated with this as his blood sugars to run high.  He does relate that his girlfriend cheated on him so he is unsure if he might have an STD.  He denies any known exposure otherwise.  He denies fevers, chills, abdominal pain, nausea, vomiting.   Penile Discharge Pertinent negatives include no chest pain, no abdominal pain and no shortness of breath.    Past Medical History:  Diagnosis Date   Diabetes mellitus without complication [facility])     Patient Active Problem List   Diagnosis Date Noted   Pain due to dental caries 05/29/2022   Dental infection 05/29/2022    Past Surgical History:  Procedure Laterality Date   HAND SURGERY     KNEE SURGERY         Home Medications    Prior to Admission medications   Medication Sig Start Date End Date Taking? Authorizing Provider  ADMELOG  SOLOSTAR 100 UNIT/ML KwikPen Inject 8-12 Units into the skin 3 (three) times daily before meals. 04/10/22  Yes Raspet, Erin K, PA-C  Glucagon  3 MG/DOSE POWD Place 3 mg into the nose once as needed for up to 1 dose. 08/23/20  Yes Trixie File, MD  Glucagon , rDNA, (GLUCAGON  EMERGENCY) 1 MG KIT Please inject im as needed for hypoglycemia 08/23/20  Yes Trixie File, MD  Insulin  Lispro-aabc, 1 U Dial, (LYUMJEV  KWIKPEN) 100 UNIT/ML SOPN Use as directed 08/23/20  Yes Trixie File, MD  Insulin  Pen Needle 32G X 4 MM MISC Use up to 5x a day as advised 08/24/20  Yes Trixie File, MD  NOVOLOG FLEXPEN 100 UNIT/ML FlexPen USE PER SLIDING  SCALE BASED ON PRE MEAL CARBS COUNT. Max dose 30units 05/08/24  Yes [provider]  valACYclovir (VALTREX) 1000 MG tablet Take 1,000 mg by mouth. 08/16/23  Yes [provider]  amoxicillin -clavulanate (AUGMENTIN ) 875-125 MG tablet Take 1 tablet by mouth 2 (two) times daily. 08/20/22   Christopher Savannah, PA-C  chlorhexidine  (PERIDEX ) 0.12 % solution Use as directed 15 mLs in the mouth or throat 2 (two) times daily. 05/29/22   Defelice, Jeanette, NP  diclofenac  (VOLTAREN ) 75 MG EC tablet Take 1 tablet (75 mg total) by mouth 2 (two) times daily. 04/10/22   Raspet, Erin K, PA-C  gabapentin  (NEURONTIN ) 300 MG capsule Take 1 capsule (300 mg total) by mouth 2 (two) times daily. 03/14/22   Kommor, Madison, MD  LANTUS SOLOSTAR 100 UNIT/ML Solostar Pen 34 Units in the morning. 01/16/22   [provider]  ondansetron  (ZOFRAN -ODT) 8 MG disintegrating tablet Take 1 tablet (8 mg total) by mouth every 8 (eight) hours as needed for nausea or vomiting. 12/08/22   Emelia Sluder, PA-C    Family History Family History  Problem Relation Age of Onset   Heart disease Paternal Grandfather     Social History Social History   Tobacco Use   Smoking status: Never   Smokeless tobacco: Never  Vaping Use   Vaping status: Never Used  Substance Use  Topics   Alcohol use: Never   Drug use: No     Allergies   Patient has no known allergies.   Review of Systems Review of Systems  Constitutional:  Negative for chills and fever.  HENT:  Negative for ear pain and sore throat.   Eyes:  Negative for pain and visual disturbance.  Respiratory:  Negative for cough and shortness of breath.   Cardiovascular:  Negative for chest pain and palpitations.  Gastrointestinal:  Negative for abdominal pain and vomiting.  Genitourinary:  Positive for penile discharge. Negative for dysuria and hematuria.  Musculoskeletal:  Negative for arthralgias and back pain.  Skin:  Negative for color change and rash.   Neurological:  Negative for seizures and syncope.  All other systems reviewed and are negative.    Physical Exam Triage Vital Signs ED Triage Vitals  Encounter Vitals Group     BP 05/10/24 1510 (!) 141/88     Girls Systolic BP Percentile --      Girls Diastolic BP Percentile --      Boys Systolic BP Percentile --      Boys Diastolic BP Percentile --      Pulse Rate 05/10/24 1510 82     Resp 05/10/24 1510 18     Temp 05/10/24 1510 98.2 F (36.8 C)     Temp Source 05/10/24 1510 Oral     SpO2 05/10/24 1510 97 %     Weight 05/10/24 1507 183 lb (83 kg)     Height 05/10/24 1507 5' 8 (1.727 m)     Head Circumference --      Peak Flow --      Pain Score 05/10/24 1507 0     Pain Loc --      Pain Education --      Exclude from Growth Chart --    No data found.  Updated Vital Signs BP (!) 141/88 (BP Location: Left Arm)   Pulse 82   Temp 98.2 F (36.8 C) (Oral)   Resp 18   Ht 5' 8 (1.727 m)   Wt 183 lb (83 kg)   SpO2 97%   BMI 27.83 kg/m   Visual Acuity Right Eye Distance:   Left Eye Distance:   Bilateral Distance:    Right Eye Near:   Left Eye Near:    Bilateral Near:     Physical Exam Vitals and nursing note reviewed.  Constitutional:      General: He is not in acute distress.    Appearance: He is well-developed.  HENT:     Head: Normocephalic and atraumatic.  Eyes:     Conjunctiva/sclera: Conjunctivae normal.  Cardiovascular:     Rate and Rhythm: Normal rate and regular rhythm.     Heart sounds: No murmur heard. Pulmonary:     Effort: Pulmonary effort is normal. No respiratory distress.     Breath sounds: Normal breath sounds.  Abdominal:     General: Bowel sounds are normal.     Palpations: Abdomen is soft.     Tenderness: There is no abdominal tenderness.  Musculoskeletal:        General: No swelling.     Cervical back: Neck supple.  Skin:    General: Skin is warm and dry.     Capillary Refill: Capillary refill takes less than 2 seconds.   Neurological:     Mental Status: He is alert.  Psychiatric:        Mood and Affect: Mood normal.  UC Treatments / Results  Labs (all labs ordered are listed, but only abnormal results are displayed) Labs Reviewed  POCT URINALYSIS DIP (MANUAL ENTRY) - Abnormal; Notable for the following components:      Result Value   Glucose, UA =100 (*)    Leukocytes, UA Trace (*)    All other components within normal limits  CYTOLOGY, (ORAL, ANAL, URETHRAL) ANCILLARY ONLY    EKG   Radiology No results found.  Procedures Procedures (including critical care time)  Medications Ordered in UC Medications - No data to display  Initial Impression / Assessment and Plan / UC Course  I have reviewed the triage vital signs and the nursing notes.  Pertinent labs & imaging results that were available during my care of the patient were reviewed by me and considered in my medical decision making (see chart for details).     Penile discharge  Screening examination for STI   Screening swab done today and results will be available in 24-48 hours. We will contact you if we need to arrange additional treatment based on your testing. Negative results will be on your MyChart account  Urinalysis done today.  This does show some glucose which we expected but otherwise does not show an infectious process.  We will send this for culture to ensure antibiotic treatment for this is not needed  Final Clinical Impressions(s) / UC Diagnoses   Final diagnoses:  Penile discharge  Screening examination for STI     Discharge Instructions      Screening swab done today and results will be available in 24-48 hours. We will contact you if we need to arrange additional treatment based on your testing. Negative results will be on your MyChart account  Urinalysis done today.  This does show some glucose which we expected but otherwise does not show an infectious process.     ED Prescriptions   None     PDMP not reviewed this encounter.   Teresa Almarie LABOR, PA-C 05/10/24 1636    Teresa Almarie LABOR, PA-C 05/10/24 1639

## 2024-05-10 NOTE — Discharge Instructions (Addendum)
 Screening swab done today and results will be available in 24-48 hours. We will contact you if we need to arrange additional treatment based on your testing. Negative results will be on your MyChart account  Urinalysis done today.  This does show some glucose which we expected but otherwise does not show an infectious process.

## 2024-05-10 NOTE — ED Triage Notes (Signed)
 Pt states that he has some penile discharge. X1 day Pt denies any other symptoms.

## 2024-05-11 ENCOUNTER — Ambulatory Visit (HOSPITAL_COMMUNITY): Payer: Self-pay

## 2024-05-11 LAB — URINE CULTURE: Culture: NO GROWTH

## 2024-05-11 LAB — CYTOLOGY, (ORAL, ANAL, URETHRAL) ANCILLARY ONLY
Chlamydia: NEGATIVE
Comment: NEGATIVE
Comment: NEGATIVE
Comment: NORMAL
Neisseria Gonorrhea: NEGATIVE
Trichomonas: NEGATIVE

## 2024-05-12 DIAGNOSIS — E1065 Type 1 diabetes mellitus with hyperglycemia: Secondary | ICD-10-CM | POA: Diagnosis not present

## 2024-05-28 DIAGNOSIS — G5603 Carpal tunnel syndrome, bilateral upper limbs: Secondary | ICD-10-CM | POA: Diagnosis not present

## 2024-08-26 DIAGNOSIS — E1065 Type 1 diabetes mellitus with hyperglycemia: Secondary | ICD-10-CM | POA: Diagnosis not present

## 2024-08-26 DIAGNOSIS — R079 Chest pain, unspecified: Secondary | ICD-10-CM | POA: Diagnosis not present

## 2024-08-26 DIAGNOSIS — Z8249 Family history of ischemic heart disease and other diseases of the circulatory system: Secondary | ICD-10-CM | POA: Diagnosis not present

## 2024-08-27 DIAGNOSIS — Z136 Encounter for screening for cardiovascular disorders: Secondary | ICD-10-CM | POA: Diagnosis not present

## 2024-08-27 DIAGNOSIS — R072 Precordial pain: Secondary | ICD-10-CM | POA: Diagnosis not present

## 2024-08-27 DIAGNOSIS — E1065 Type 1 diabetes mellitus with hyperglycemia: Secondary | ICD-10-CM | POA: Diagnosis not present

## 2024-08-27 DIAGNOSIS — Z8249 Family history of ischemic heart disease and other diseases of the circulatory system: Secondary | ICD-10-CM | POA: Diagnosis not present

## 2024-08-28 DIAGNOSIS — I499 Cardiac arrhythmia, unspecified: Secondary | ICD-10-CM | POA: Diagnosis not present

## 2024-09-03 ENCOUNTER — Emergency Department (HOSPITAL_BASED_OUTPATIENT_CLINIC_OR_DEPARTMENT_OTHER)

## 2024-09-03 ENCOUNTER — Encounter (HOSPITAL_BASED_OUTPATIENT_CLINIC_OR_DEPARTMENT_OTHER): Payer: Self-pay | Admitting: Emergency Medicine

## 2024-09-03 ENCOUNTER — Other Ambulatory Visit: Payer: Self-pay

## 2024-09-03 ENCOUNTER — Emergency Department (HOSPITAL_BASED_OUTPATIENT_CLINIC_OR_DEPARTMENT_OTHER)
Admission: EM | Admit: 2024-09-03 | Discharge: 2024-09-03 | Disposition: A | Discharge status: Home / Self Care | Attending: Emergency Medicine | Admitting: Emergency Medicine

## 2024-09-03 ENCOUNTER — Emergency Department (HOSPITAL_BASED_OUTPATIENT_CLINIC_OR_DEPARTMENT_OTHER): Admitting: Radiology

## 2024-09-03 DIAGNOSIS — R109 Unspecified abdominal pain: Secondary | ICD-10-CM | POA: Insufficient documentation

## 2024-09-03 DIAGNOSIS — R0789 Other chest pain: Secondary | ICD-10-CM | POA: Diagnosis not present

## 2024-09-03 DIAGNOSIS — Z794 Long term (current) use of insulin: Secondary | ICD-10-CM | POA: Insufficient documentation

## 2024-09-03 DIAGNOSIS — E109 Type 1 diabetes mellitus without complications: Secondary | ICD-10-CM | POA: Diagnosis not present

## 2024-09-03 DIAGNOSIS — R079 Chest pain, unspecified: Secondary | ICD-10-CM

## 2024-09-03 DIAGNOSIS — Z79899 Other long term (current) drug therapy: Secondary | ICD-10-CM | POA: Diagnosis not present

## 2024-09-03 DIAGNOSIS — R10A1 Flank pain, right side: Secondary | ICD-10-CM | POA: Diagnosis not present

## 2024-09-03 LAB — URINALYSIS, ROUTINE W REFLEX MICROSCOPIC
Bacteria, UA: NONE SEEN
Bilirubin Urine: NEGATIVE
Glucose, UA: NEGATIVE mg/dL
Hgb urine dipstick: NEGATIVE
Ketones, ur: NEGATIVE mg/dL
Leukocytes,Ua: NEGATIVE
Nitrite: NEGATIVE
Protein, ur: 30 mg/dL — AB
Specific Gravity, Urine: >1.046 — ABNORMAL HIGH (ref 1.005–1.030)
pH: 7 (ref 5.0–8.0)

## 2024-09-03 LAB — BASIC METABOLIC PANEL WITH GFR
Anion gap: 10 (ref 5–15)
BUN: 14 mg/dL (ref 6–20)
CO2: 30 mmol/L (ref 22–32)
Calcium: 10.3 mg/dL (ref 8.9–10.3)
Chloride: 102 mmol/L (ref 98–111)
Creatinine, Ser: 0.95 mg/dL (ref 0.61–1.24)
GFR, Estimated: >60 mL/min (ref >60)
Glucose, Bld: 71 mg/dL (ref 70–99)
Potassium: 3.7 mmol/L (ref 3.5–5.1)
Sodium: 141 mmol/L (ref 135–145)

## 2024-09-03 LAB — TROPONIN T, HIGH SENSITIVITY
Troponin T High Sensitivity: <15 ng/L (ref 0–19)
Troponin T High Sensitivity: <15 ng/L (ref 0–19)

## 2024-09-03 LAB — CBC
HCT: 41.8 % (ref 39.0–52.0)
Hemoglobin: 14.1 g/dL (ref 13.0–17.0)
MCH: 29.3 pg (ref 26.0–34.0)
MCHC: 33.7 g/dL (ref 30.0–36.0)
MCV: 86.9 fL (ref 80.0–100.0)
Platelets: 212 K/uL (ref 150–400)
RBC: 4.81 MIL/uL (ref 4.22–5.81)
RDW: 13.2 % (ref 11.5–15.5)
WBC: 7.1 K/uL (ref 4.0–10.5)
nRBC: 0 % (ref 0.0–0.2)

## 2024-09-03 LAB — LIPASE, BLOOD: Lipase: 27 U/L (ref 11–51)

## 2024-09-03 MED ORDER — KETOROLAC TROMETHAMINE 15 MG/ML IJ SOLN
15.0000 mg | Freq: Once | INTRAMUSCULAR | Status: AC
  Administered 2024-09-03: 15 mg via INTRAVENOUS
  Filled 2024-09-03: qty 1

## 2024-09-03 MED ORDER — MORPHINE SULFATE (PF) 4 MG/ML IV SOLN
4.0000 mg | Freq: Once | INTRAVENOUS | Status: AC
  Administered 2024-09-03: 4 mg via INTRAVENOUS
  Filled 2024-09-03: qty 1

## 2024-09-03 MED ORDER — CYCLOBENZAPRINE HCL 10 MG PO TABS: 10.0000 mg | ORAL_TABLET | Freq: Two times a day (BID) | ORAL | 0 refills | Status: AC | PRN

## 2024-09-03 MED ORDER — IOHEXOL 350 MG/ML SOLN
100.0000 mL | Freq: Once | INTRAVENOUS | Status: AC | PRN
Start: 2024-09-03 — End: 2024-09-03
  Administered 2024-09-03: 100 mL via INTRAVENOUS

## 2024-09-03 MED ORDER — LIDOCAINE 5 % EX PTCH
1.0000 | MEDICATED_PATCH | CUTANEOUS | Status: DC
  Administered 2024-09-03: 1 via TRANSDERMAL
  Filled 2024-09-03: qty 1

## 2024-09-03 MED ORDER — NAPROXEN 500 MG PO TABS
500.0000 mg | ORAL_TABLET | Freq: Two times a day (BID) | ORAL | 0 refills | Status: AC
Start: 2024-09-03 — End: ?

## 2024-09-03 NOTE — ED Provider Notes (Signed)
 [place] [facility] DEPARTMENT AT [facility] Provider Note   CSN: [id number] Arrival date & time: 09/03/24  1530     Patient presents with: Chest Pain   Alan Burke is a 33 y.o. male.  Patient is a 33 year old male with a history of type 1 diabetes who presents to the ED for increasing chest pain for the past several weeks.  Patient notes he has an intermittent chest pains like this for several years but symptoms have continued to get worse over the past few weeks.  He notes 3 days ago he had the worst symptoms he has ever had.  Notes pain is always on the left side and described as crushing/stabbing.  He states his diabetes was poorly controlled in the past but he has been on medications regularly recently.  He states he did see cardiology about a week ago and they want to do a stress test but this is not scheduled until January 2026.  He was told if chest pain continued to go to the ED.  He has not noticed any positions or activities that make the pain worse.  Has been taking ibuprofen  for pain with minimal relief.  Notes a family history of coronary artery disease.  He also notes he is having right sided flank pain for the past couple days.  He states he did begin working out again that could have pulled a muscle but he is unsure.  Denies fevers, chills, headache, shortness of breath, abdominal pain, nausea/vomiting/diarrhea, urinary symptoms.  No further complaints.  Chest Pain      Prior to Admission medications   Medication Sig Start Date End Date Taking? Authorizing Provider  Continuous Glucose Sensor (DEXCOM G7 SENSOR) MISC Use as directed to monitor blood sugar levels. Follow package directions for replacement. 05/12/24  Yes [provider]  cyclobenzaprine  (FLEXERIL ) 10 MG tablet Take 1 tablet (10 mg total) by mouth 2 (two) times daily as needed for muscle spasms. 09/03/24  Yes Aloysuis Ribaudo, Thersia RAMAN, PA-C  ibuprofen  (ADVIL ) 800 MG tablet Take 800 mg by mouth every 8  (eight) hours as needed for mild pain (pain score 1-3). 10/25/23  Yes [provider]  naproxen  (NAPROSYN ) 500 MG tablet Take 1 tablet (500 mg total) by mouth 2 (two) times daily. 09/03/24  Yes Douglas Rooks, Thersia RAMAN, PA-C  rosuvastatin (CRESTOR) 10 MG tablet Take 10 mg by mouth daily. 09/02/24 09/02/25 Yes [provider]  ADMELOG  SOLOSTAR 100 UNIT/ML KwikPen Inject 8-12 Units into the skin 3 (three) times daily before meals. 04/10/22   Raspet, Erin K, PA-C  amoxicillin -clavulanate (AUGMENTIN ) 875-125 MG tablet Take 1 tablet by mouth 2 (two) times daily. 08/20/22   Christopher Savannah, PA-C  chlorhexidine  (PERIDEX ) 0.12 % solution Use as directed 15 mLs in the mouth or throat 2 (two) times daily. 05/29/22   Defelice, Jeanette, NP  diclofenac  (VOLTAREN ) 75 MG EC tablet Take 1 tablet (75 mg total) by mouth 2 (two) times daily. 04/10/22   Raspet, Erin K, PA-C  gabapentin  (NEURONTIN ) 300 MG capsule Take 1 capsule (300 mg total) by mouth 2 (two) times daily. 03/14/22   Kommor, Madison, MD  Glucagon  3 MG/DOSE POWD Place 3 mg into the nose once as needed for up to 1 dose. 08/23/20   Trixie File, MD  Glucagon , rDNA, (GLUCAGON  EMERGENCY) 1 MG KIT Please inject im as needed for hypoglycemia 08/23/20   Trixie File, MD  Insulin  Lispro-aabc, 1 U Dial, (LYUMJEV  KWIKPEN) 100 UNIT/ML SOPN Use as directed 08/23/20  Trixie File, MD  Insulin  Pen Needle 32G X 4 MM MISC Use up to 5x a day as advised 08/24/20   Trixie File, MD  LANTUS SOLOSTAR 100 UNIT/ML Solostar Pen 34 Units in the morning. 01/16/22   [provider]  NOVOLOG FLEXPEN 100 UNIT/ML FlexPen USE PER SLIDING SCALE BASED ON PRE MEAL CARBS COUNT. Max dose 30units 05/08/24   [provider]  ondansetron  (ZOFRAN -ODT) 8 MG disintegrating tablet Take 1 tablet (8 mg total) by mouth every 8 (eight) hours as needed for nausea or vomiting. 12/08/22   Emelia Sluder, PA-C  valACYclovir (VALTREX) 1000 MG tablet Take 1,000 mg by mouth.  08/16/23   [provider]    Allergies: Patient has no known allergies.    Review of Systems  Cardiovascular:  Positive for chest pain.    Updated Vital Signs BP 117/83   Pulse 74   Temp 98.2 F (36.8 C)   Resp 16   SpO2 99%   Physical Exam  (all labs ordered are listed, but only abnormal results are displayed) Labs Reviewed  URINALYSIS, ROUTINE W REFLEX MICROSCOPIC - Abnormal; Notable for the following components:      Result Value   Specific Gravity, Urine >1.046 (*)    Protein, ur 30 (*)    All other components within normal limits  BASIC METABOLIC PANEL WITH GFR  CBC  LIPASE, BLOOD  TROPONIN T, HIGH SENSITIVITY  TROPONIN T, HIGH SENSITIVITY    EKG: None  Radiology: CT Angio Chest PE W and/or Wo Contrast Result Date: 09/03/2024 CLINICAL DATA:  Left-sided chest pain. Concern for pulmonary embolism. Abdominal pain. EXAM: CT ANGIOGRAPHY CHEST CT ABDOMEN AND PELVIS WITH CONTRAST TECHNIQUE: Multidetector CT imaging of the chest was performed using the standard protocol during bolus administration of intravenous contrast. Multiplanar CT image reconstructions and MIPs were obtained to evaluate the vascular anatomy. Multidetector CT imaging of the abdomen and pelvis was performed using the standard protocol during bolus administration of intravenous contrast. RADIATION DOSE REDUCTION: This exam was performed according to the departmental dose-optimization program which includes automated exposure control, adjustment of the mA and/or kV according to patient size and/or use of iterative reconstruction technique. CONTRAST:  OMNIPAQUE  IOHEXOL  350 MG/ML SOLN COMPARISON:  CT dated 09/29/2023. FINDINGS: Evaluation of this exam is limited due to respiratory motion. CTA CHEST FINDINGS Cardiovascular: There is no cardiomegaly or pericardial effusion. The thoracic aorta is unremarkable. No pulmonary artery embolus identified. Mediastinum/Nodes: No hilar or mediastinal  adenopathy. The esophagus is grossly unremarkable. No mediastinal fluid collection. Lungs/Pleura: No focal consolidation, pleural effusion, pneumothorax. Several pulmonary nodules measure up to 4 mm similar to prior CT. The central airways are patent. Musculoskeletal: No acute osseous pathology. Review of the MIP images confirms the above findings. CT ABDOMEN and PELVIS FINDINGS No intra-abdominal free air or free fluid. Hepatobiliary: No focal liver abnormality is seen. No gallstones, gallbladder wall thickening, or biliary dilatation. Pancreas: Unremarkable. No pancreatic ductal dilatation or surrounding inflammatory changes. Spleen: Normal in size without focal abnormality. Adrenals/Urinary Tract: The adrenal glands, kidneys, and urinary bladder appear unremarkable. Stomach/Bowel: Moderate stool throughout the colon. There is no bowel obstruction or active inflammation. The appendix is normal. Vascular/Lymphatic: The abdominal aorta and IVC are unremarkable. No portal venous gas. There is no adenopathy. Reproductive: The prostate is grossly unremarkable Other: None Musculoskeletal: No acute or significant osseous findings. Review of the MIP images confirms the above findings. IMPRESSION: 1. No acute intrathoracic, abdominal, or pelvic pathology. No CT evidence of  pulmonary artery embolus. 2. Moderate colonic stool burden. No bowel obstruction. Normal appendix. Electronically Signed   By: Vanetta Chou M.D.   On: 09/03/2024 18:23   CT ABDOMEN PELVIS W CONTRAST Result Date: 09/03/2024 CLINICAL DATA:  Left-sided chest pain. Concern for pulmonary embolism. Abdominal pain. EXAM: CT ANGIOGRAPHY CHEST CT ABDOMEN AND PELVIS WITH CONTRAST TECHNIQUE: Multidetector CT imaging of the chest was performed using the standard protocol during bolus administration of intravenous contrast. Multiplanar CT image reconstructions and MIPs were obtained to evaluate the vascular anatomy. Multidetector CT imaging of the abdomen and  pelvis was performed using the standard protocol during bolus administration of intravenous contrast. RADIATION DOSE REDUCTION: This exam was performed according to the departmental dose-optimization program which includes automated exposure control, adjustment of the mA and/or kV according to patient size and/or use of iterative reconstruction technique. CONTRAST:  OMNIPAQUE  IOHEXOL  350 MG/ML SOLN COMPARISON:  CT dated 09/29/2023. FINDINGS: Evaluation of this exam is limited due to respiratory motion. CTA CHEST FINDINGS Cardiovascular: There is no cardiomegaly or pericardial effusion. The thoracic aorta is unremarkable. No pulmonary artery embolus identified. Mediastinum/Nodes: No hilar or mediastinal adenopathy. The esophagus is grossly unremarkable. No mediastinal fluid collection. Lungs/Pleura: No focal consolidation, pleural effusion, pneumothorax. Several pulmonary nodules measure up to 4 mm similar to prior CT. The central airways are patent. Musculoskeletal: No acute osseous pathology. Review of the MIP images confirms the above findings. CT ABDOMEN and PELVIS FINDINGS No intra-abdominal free air or free fluid. Hepatobiliary: No focal liver abnormality is seen. No gallstones, gallbladder wall thickening, or biliary dilatation. Pancreas: Unremarkable. No pancreatic ductal dilatation or surrounding inflammatory changes. Spleen: Normal in size without focal abnormality. Adrenals/Urinary Tract: The adrenal glands, kidneys, and urinary bladder appear unremarkable. Stomach/Bowel: Moderate stool throughout the colon. There is no bowel obstruction or active inflammation. The appendix is normal. Vascular/Lymphatic: The abdominal aorta and IVC are unremarkable. No portal venous gas. There is no adenopathy. Reproductive: The prostate is grossly unremarkable Other: None Musculoskeletal: No acute or significant osseous findings. Review of the MIP images confirms the above findings. IMPRESSION: 1. No acute  intrathoracic, abdominal, or pelvic pathology. No CT evidence of pulmonary artery embolus. 2. Moderate colonic stool burden. No bowel obstruction. Normal appendix. Electronically Signed   By: Vanetta Chou M.D.   On: 09/03/2024 18:23   DG Chest 2 View Result Date: 09/03/2024 EXAM: 2 VIEW(S) XRAY OF THE CHEST 09/03/2024 04:01:00 PM COMPARISON: 09/28/2023 CLINICAL HISTORY: chest pain FINDINGS: LUNGS AND PLEURA: No focal pulmonary opacity. No pleural effusion. No pneumothorax. HEART AND MEDIASTINUM: No acute abnormality of the cardiac and mediastinal silhouettes. BONES AND SOFT TISSUES: No acute osseous abnormality. IMPRESSION: 1. No acute cardiopulmonary process. Electronically signed by: Lynwood Seip MD 09/03/2024 04:20 PM EST RP Workstation: [id number]      Medications Ordered in the ED  lidocaine  (LIDODERM ) 5 % 1 patch (1 patch Transdermal Patch Applied 09/03/24 1810)  ketorolac  (TORADOL ) 15 MG/ML injection 15 mg (15 mg Intravenous Given 09/03/24 1807)  morphine  (PF) 4 MG/ML injection 4 mg (4 mg Intravenous Given 09/03/24 1809)  iohexol  (OMNIPAQUE ) 350 MG/ML injection 100 mL (100 mLs Intravenous Contrast Given 09/03/24 1756)                                 Medical Decision Making Amount and/or Complexity of Data Reviewed Labs: ordered. Radiology: ordered.  Risk Prescription drug management.   Patient is a 33 year old male with  a history of hyperlipidemia who presents to the ED for increasing chest pain.  Notes he has had intermittent chest pain for the past several years but symptoms have increased in the past few weeks.  Also notes flank pain.  On exam patient is alert and in no acute distress.  Physical exam as noted above.  Vital signs stable.  Lab workup overall unremarkable including normal troponin, CBC, CMP, UA.  Chest x-ray negative for acute process.  EKG shows sinus rhythm.  CT chest PE obtained due to patient's ongoing symptoms for several years.  Negative for acute  abnormalities today.  CT abdomen pelvis shows mild constipation but otherwise no acute abnormalities.  Suspect patient's flank pain secondary to musculoskeletal pain.  Less concerns for UTI, pyelonephritis, nephrolithiasis.  Less concerns for acute ACS as etiology for patient's chest pain today due to ongoing symptoms for several years and reassuring workup today.  Heart score 1.  Patient given Toradol , morphine , lidocaine  patch with some improvement.  Otherwise stable for discharge home.  Patient has had cardiology follow-up in the last week and they have recommended outpatient stress testing.  I have advised patient to call cardiology to schedule follow-up appointment to inquire about moving his stress test up.  Advised symptomatic care for flank pain.  Prescribed Flexeril .  Strict return precautions provided for worsening symptoms.   Final diagnoses:  Chest pain, unspecified type  Right flank pain    ED Discharge Orders          Ordered    cyclobenzaprine  (FLEXERIL ) 10 MG tablet  2 times daily PRN        09/03/24 2128    naproxen  (NAPROSYN ) 500 MG tablet  2 times daily        09/03/24 2128               Neysa Thersia RAMAN, PA-C 09/04/24 0017    Pamella Ozell LABOR, DO 09/04/24 0020

## 2024-09-03 NOTE — Discharge Instructions (Addendum)
 May take naproxen  twice a day as needed for pain.  Do not take extra ibuprofen  or diclofenac  at the same time.  May take Flexeril  2-3 times a day as needed for pain/muscle spasms.  Apply heat pads to the back to help with pain.  May also wear lidocaine  patch for up to 12 hours at a time.  You can get these over-the-counter.  Please call cardiology office to see if you can move up your stress test at all.  Return to ED sooner if any symptoms worsen including severe control chest pain, difficulty breathing, syncopal episode.

## 2024-09-03 NOTE — ED Triage Notes (Signed)
 Left side chest pain x  for a minute More than 3 days Now having leftside pain too  Diabetic  Was seen by cards 3 days ago per patient

## 2024-09-08 DIAGNOSIS — I361 Nonrheumatic tricuspid (valve) insufficiency: Secondary | ICD-10-CM | POA: Diagnosis not present

## 2024-09-29 DIAGNOSIS — R072 Precordial pain: Secondary | ICD-10-CM | POA: Diagnosis not present
